# Patient Record
Sex: Male | Born: 1985
Health system: Southern US, Community
[De-identification: ages and names within clinical notes are randomized; demographics above are authoritative.]

## PROBLEM LIST (undated history)

## (undated) DIAGNOSIS — Z8619 Personal history of other infectious and parasitic diseases: Secondary | ICD-10-CM

## (undated) HISTORY — PX: NO PAST SURGERIES: SHX2092

## (undated) HISTORY — DX: Personal history of other infectious and parasitic diseases: Z86.19

---

## 2015-05-28 ENCOUNTER — Ambulatory Visit (INDEPENDENT_AMBULATORY_CARE_PROVIDER_SITE_OTHER): Payer: BLUE CROSS/BLUE SHIELD | Admitting: Family Medicine

## 2015-05-28 ENCOUNTER — Ambulatory Visit (HOSPITAL_COMMUNITY)
Admission: RE | Admit: 2015-05-28 | Discharge: 2015-05-28 | Disposition: A | Discharge status: Home / Self Care | Payer: BLUE CROSS/BLUE SHIELD | Source: Ambulatory Visit | Attending: Family Medicine | Admitting: Family Medicine

## 2015-05-28 ENCOUNTER — Encounter: Payer: Self-pay | Admitting: Family Medicine

## 2015-05-28 ENCOUNTER — Inpatient Hospital Stay (HOSPITAL_COMMUNITY): Admission: RE | Admit: 2015-05-28 | Payer: Self-pay | Source: Ambulatory Visit

## 2015-05-28 VITALS — BP 128/78 | HR 87 | Temp 97.3°F | Ht 70.0 in | Wt 235.6 lb

## 2015-05-28 DIAGNOSIS — R1011 Right upper quadrant pain: Secondary | ICD-10-CM

## 2015-05-28 DIAGNOSIS — M659 Synovitis and tenosynovitis, unspecified: Secondary | ICD-10-CM | POA: Diagnosis not present

## 2015-05-28 DIAGNOSIS — R932 Abnormal findings on diagnostic imaging of liver and biliary tract: Secondary | ICD-10-CM | POA: Diagnosis not present

## 2015-05-28 DIAGNOSIS — IMO0002 Reserved for concepts with insufficient information to code with codable children: Secondary | ICD-10-CM

## 2015-05-28 NOTE — Progress Notes (Signed)
BP 128/78 mmHg  Pulse 87  Temp(Src) 97.3 F (36.3 C) (Oral)  Ht 5\' 10"  (1.778 m)  Wt 235 lb 9.6 oz (106.867 kg)  BMI 33.80 kg/m2   Subjective:    Patient ID: Daniel NgoJerry Speth, male    DOB: [birth year], 29 y.o.   MRN: [id number]  HPI: Daniel Ramos is a 29 y.o. male presenting on 05/28/2015 for RUQ pain and Right elbow pain   HPI Right upper quadrant abdominal pain For the past month patient has had intermittent colicky right upper quadrant abdominal pain that is worse after he eats. The pain is located just under his ribs on the right side. He does not have it on days when he does not eat right away until he eats. He has not noticed any specific foods over any others that make it worse. Today he does not currently have any because he is not eating anything yet today. He has never had this before in his life. The pain does not radiate anywhere else. The pain is described as kind of a crampy colicky abdominal pain.  Right forearm pain Patient has pain in his right forearm that is worse when he is gripping and lifting things at his workplace. He gets better when he rests it for some time but has not fully gone away. This pain has been an issue for the past week. He is taking a little bit of aspirin but nothing consistent for this. He denies any overlying skin changes or weakness or numbness. He has tried to use a brace which did not help much.  Relevant past medical, surgical, family and social history reviewed and updated as indicated. Interim medical history since our last visit reviewed. Allergies and medications reviewed and updated.  Review of Systems  Constitutional: Negative for fever and chills.  HENT: Negative for congestion, ear discharge and ear pain.   Eyes: Negative for discharge and visual disturbance.  Respiratory: Negative for cough, shortness of breath and wheezing.   Cardiovascular: Negative for chest pain and leg swelling.  Gastrointestinal: Positive for abdominal pain.  Negative for nausea, vomiting, diarrhea, constipation, blood in stool and abdominal distention.  Genitourinary: Negative for difficulty urinating.  Musculoskeletal: Positive for myalgias. Negative for back pain, joint swelling, arthralgias and gait problem.  Skin: Negative for rash.  Neurological: Negative for syncope, light-headedness and headaches.  All other systems reviewed and are negative.   Per HPI unless specifically indicated above     Medication List    Notice  As of 05/28/2015  2:06 PM   You have not been prescribed any medications.         Objective:    BP 128/78 mmHg  Pulse 87  Temp(Src) 97.3 F (36.3 C) (Oral)  Ht 5\' 10"  (1.778 m)  Wt 235 lb 9.6 oz (106.867 kg)  BMI 33.80 kg/m2  Wt Readings from Last 3 Encounters:  05/28/15 235 lb 9.6 oz (106.867 kg)    Physical Exam  Constitutional: He is oriented to person, place, and time. He appears well-developed and well-nourished. No distress.  Eyes: Conjunctivae and EOM are normal. Pupils are equal, round, and reactive to light. Right eye exhibits no discharge. No scleral icterus.  Cardiovascular: Normal rate, regular rhythm, normal heart sounds and intact distal pulses.   No murmur heard. Pulmonary/Chest: Effort normal and breath sounds normal. No respiratory distress. He has no wheezes.  Abdominal: Soft. He exhibits no distension. There is no tenderness (No tenderness elicited on exam because patient does not  currently have it.). There is no rebound and no guarding.  Musculoskeletal: Normal range of motion. He exhibits no edema or tenderness (no tenderness elicited upon palpation.  small amounts of aching pain elicited with grip.).  Neurological: He is alert and oriented to person, place, and time. Coordination normal.  Skin: Skin is warm and dry. No rash noted. He is not diaphoretic.  Psychiatric: He has a normal mood and affect. His behavior is normal.  Vitals reviewed.   No results found for this or any  previous visit.    Assessment & Plan:   Problem List Items Addressed This Visit    None    Visit Diagnoses    RUQ abdominal pain    -  Primary    Relevant Orders    US Abdomen Limited RUQ    Tendinitis of elbow or forearm        right, pt is right handed, NSAIDs, return if not improved        Follow up plan: Return if symptoms worsen or fail to improve.  Counseling provided for all of the vaccine components Orders Placed This Encounter  Procedures  . US Abdomen Limited RUQ    Arville Care, MD [facility] 05/28/2015, 2:06 PM

## 2015-06-01 ENCOUNTER — Telehealth: Payer: Self-pay | Admitting: Family Medicine

## 2015-06-01 ENCOUNTER — Other Ambulatory Visit: Payer: Self-pay | Admitting: *Deleted

## 2015-06-01 ENCOUNTER — Other Ambulatory Visit: Payer: Self-pay

## 2015-06-01 DIAGNOSIS — R1012 Left upper quadrant pain: Secondary | ICD-10-CM

## 2015-06-01 DIAGNOSIS — R1011 Right upper quadrant pain: Secondary | ICD-10-CM

## 2015-06-01 NOTE — Telephone Encounter (Signed)
Left message, negative gall bladder scan but referral for HIDA test ordered.

## 2015-06-04 ENCOUNTER — Encounter (HOSPITAL_COMMUNITY): Payer: Self-pay

## 2015-06-04 ENCOUNTER — Encounter (HOSPITAL_COMMUNITY)
Admission: RE | Admit: 2015-06-04 | Discharge: 2015-06-04 | Disposition: A | Discharge status: Home / Self Care | Payer: BLUE CROSS/BLUE SHIELD | Source: Ambulatory Visit | Attending: Family Medicine | Admitting: Family Medicine

## 2015-06-04 DIAGNOSIS — R1012 Left upper quadrant pain: Secondary | ICD-10-CM | POA: Insufficient documentation

## 2015-06-04 MED ORDER — SINCALIDE 5 MCG IJ SOLR
INTRAMUSCULAR | Status: AC
  Administered 2015-06-04: 2.14 ug via INTRAVENOUS
  Filled 2015-06-04: qty 5

## 2015-06-04 MED ORDER — STERILE WATER FOR INJECTION IJ SOLN
INTRAMUSCULAR | Status: AC
  Administered 2015-06-04: 2.14 mL via INTRAVENOUS
  Filled 2015-06-04: qty 10

## 2015-06-04 MED ORDER — TECHNETIUM TC 99M MEBROFENIN IV KIT
5.0000 | PACK | Freq: Once | INTRAVENOUS | Status: AC | PRN
  Administered 2015-06-04: 5.5 via INTRAVENOUS

## 2015-06-04 MED ORDER — SODIUM CHLORIDE 0.9 % IJ SOLN
INTRAMUSCULAR | Status: AC
  Filled 2015-06-04: qty 200

## 2015-06-04 MED ORDER — STERILE WATER FOR INJECTION IJ SOLN
INTRAMUSCULAR | Status: AC
  Filled 2015-06-04: qty 10

## 2015-06-07 ENCOUNTER — Other Ambulatory Visit (HOSPITAL_COMMUNITY): Payer: BLUE CROSS/BLUE SHIELD

## 2017-03-01 DIAGNOSIS — Z23 Encounter for immunization: Secondary | ICD-10-CM | POA: Diagnosis not present

## 2017-08-16 ENCOUNTER — Other Ambulatory Visit: Payer: Self-pay

## 2017-08-16 ENCOUNTER — Ambulatory Visit: Payer: BLUE CROSS/BLUE SHIELD | Admitting: Physician Assistant

## 2017-08-16 ENCOUNTER — Encounter: Payer: Self-pay | Admitting: Physician Assistant

## 2017-08-16 VITALS — BP 118/82 | HR 73 | Temp 98.2°F | Resp 17 | Ht 68.5 in | Wt 240.8 lb

## 2017-08-16 DIAGNOSIS — R21 Rash and other nonspecific skin eruption: Secondary | ICD-10-CM

## 2017-08-16 MED ORDER — PERMETHRIN 5 % EX CREA: 1.0000 "application " | TOPICAL_CREAM | Freq: Once | CUTANEOUS | 0 refills | Status: AC

## 2017-08-16 MED ORDER — TRIAMCINOLONE ACETONIDE 0.1 % EX CREA: 1.0000 "application " | TOPICAL_CREAM | Freq: Two times a day (BID) | CUTANEOUS | 0 refills | Status: DC

## 2017-08-16 NOTE — Progress Notes (Signed)
Patient presents to clinic today to establish care.  Patient endorses a 2 month history of scattered bumps on his body that are intensely pruritic. States that some areas resolve and new ones form. Denies contact with similar symptoms. Denies fever, chills, malaise or fatigue. Does note he travels a lot for work and has to sleep in various hotels.   Past Medical History:  Diagnosis Date  . History of chicken pox     Past Surgical History:  Procedure Laterality Date  . NO PAST SURGERIES      No current outpatient medications on file prior to visit.   No current facility-administered medications on file prior to visit.     Allergies  Allergen Reactions  . Amoxicillin Rash    Family History  Problem Relation Age of Onset  . Miscarriages / [place] Mother   . Alcohol abuse Father   . COPD Maternal Grandmother   . Cancer Maternal Grandfather   . Cancer Paternal Grandmother   . Alcohol abuse Paternal Grandfather   . Stroke Paternal Grandfather   . Asthma Son   . Asthma Son     Social History   Socioeconomic History  . Marital status: Married    Spouse name: Not on file  . Number of children: Not on file  . Years of education: Not on file  . Highest education level: Not on file  Occupational History  . Not on file  Social Needs  . Financial resource strain: Not on file  . Food insecurity:    Worry: Not on file    Inability: Not on file  . Transportation needs:    Medical: Not on file    Non-medical: Not on file  Tobacco Use  . Smoking status: Never Smoker  . Smokeless tobacco: Former Neurosurgeon    Types: Chew  Substance and Sexual Activity  . Alcohol use: No    Alcohol/week: 0.0 oz  . Drug use: No  . Sexual activity: Yes    Comment: married 12 years  Lifestyle  . Physical activity:    Days per week: Not on file    Minutes per session: Not on file  . Stress: Not on file  Relationships  . Social connections:    Talks on phone: Not on file    Gets  together: Not on file    Attends religious service: Not on file    Active member of club or organization: Not on file    Attends meetings of clubs or organizations: Not on file    Relationship status: Not on file  . Intimate partner violence:    Fear of current or ex partner: Not on file    Emotionally abused: Not on file    Physically abused: Not on file    Forced sexual activity: Not on file  Other Topics Concern  . Not on file  Social History Narrative  . Not on file   Review of Systems  Constitutional: Negative for fever and weight loss.  HENT: Negative for ear discharge, ear pain, hearing loss and tinnitus.   Eyes: Negative for blurred vision, double vision, photophobia and pain.  Respiratory: Negative for cough and shortness of breath.   Cardiovascular: Negative for chest pain and palpitations.  Gastrointestinal: Negative for abdominal pain, blood in stool, constipation, diarrhea, heartburn, melena, nausea and vomiting.  Genitourinary: Negative for dysuria, flank pain, frequency, hematuria and urgency.  Musculoskeletal: Negative for falls.  Skin: Positive for itching and rash.  Neurological: Negative for  dizziness, loss of consciousness and headaches.  Endo/Heme/Allergies: Negative for environmental allergies.  Psychiatric/Behavioral: Negative for depression, hallucinations, substance abuse and suicidal ideas. The patient is not nervous/anxious and does not have insomnia.    BP 118/82   Pulse 73   Temp 98.2 F (36.8 C) (Oral)   Resp 17   Ht 5' 8.5" (1.74 m)   Wt 240 lb 12.8 oz (109.2 kg)   SpO2 97%   BMI 36.08 kg/m   Physical Exam  Constitutional: He is oriented to person, place, and time and well-developed, well-nourished, and in no distress.  HENT:  Head: Normocephalic and atraumatic.  Eyes: Conjunctivae are normal.  Neck: Neck supple.  Cardiovascular: Normal rate, regular rhythm, normal heart sounds and intact distal pulses.  Pulmonary/Chest: Effort normal and  breath sounds normal. No respiratory distress. He has no wheezes. He has no rales. He exhibits no tenderness.  Neurological: He is alert and oriented to person, place, and time.  Skin: Skin is warm and dry.  Scattered macular lesions of torso, back, lower shins/ankles, forearms. No burrowing noted. No linear pattern. Exam negative for dermatographia. Lesions do not blanch with pressure. Concern for bites.   Psychiatric: Affect normal.  Vitals reviewed.  Assessment/Plan: 1. Rash and nonspecific skin eruption Concern for bites. Discussed measures to take at home. Thoroughly clean all travel bags. Rx Permethrin. Kenalog given. OTC medications and supportive measures reviewed. Close follow-up scheduled. Will do CPE at that time.   - permethrin (ELIMITE) 5 % cream; Apply 1 application topically once for 1 dose. Wash off in 7-8 hours as directed on tubing.  Dispense: 60 g; Refill: 0 - triamcinolone cream (KENALOG) 0.1 %; Apply 1 application topically 2 (two) times daily. For 2 weeks max.  Dispense: 30 g; Refill: 0   Piedad ClimesWilliam Cody Ivannah Zody, PA-C

## 2017-08-16 NOTE — Patient Instructions (Signed)
Please apply the permethrin in the evening. Wash off as directed.  The triamcinolone ointment can be applied to current bites to help with itch. Do not use more than as directed.  Start a Claritin daily over the next week to help with itch until we get this resolved.   I would wash out travel bags thoroughly as this could be a potential vector carrying the culprit.   Follow-up with me in 1-2 weeks for a physical

## 2017-12-04 ENCOUNTER — Ambulatory Visit: Payer: BLUE CROSS/BLUE SHIELD

## 2017-12-04 ENCOUNTER — Other Ambulatory Visit: Payer: Self-pay | Admitting: Family Medicine

## 2017-12-04 DIAGNOSIS — M542 Cervicalgia: Secondary | ICD-10-CM

## 2017-12-21 DIAGNOSIS — Z111 Encounter for screening for respiratory tuberculosis: Secondary | ICD-10-CM | POA: Diagnosis not present

## 2018-02-24 DIAGNOSIS — Z23 Encounter for immunization: Secondary | ICD-10-CM | POA: Diagnosis not present

## 2018-02-27 ENCOUNTER — Telehealth: Payer: Self-pay | Admitting: Emergency Medicine

## 2018-02-27 DIAGNOSIS — Z0184 Encounter for antibody response examination: Secondary | ICD-10-CM

## 2018-02-27 NOTE — Telephone Encounter (Signed)
He can have lab appt only for MMR titers

## 2018-02-27 NOTE — Telephone Encounter (Signed)
Please advise if patient needs a lab visit only or to schedule a CPE  Copied from [place] [id number]. Topic: General - Other >> Feb 27, 2018  2:31 PM Yvette Rack wrote: Reason for CRM: pt wife Janett Billow calling to schedule an appt for him to have a MMR titer for his job please call at [phone]

## 2018-02-28 NOTE — Addendum Note (Signed)
Addended by: Con Memos on: 02/28/2018 12:54 PM   Modules accepted: Orders

## 2018-02-28 NOTE — Telephone Encounter (Signed)
Advised patient wife ok per PCP to schedule for lab visit for MMR titers. Orders placed.  CRM created and ok for [facility] to schedule lab visit for MMR titer.

## 2018-03-08 ENCOUNTER — Other Ambulatory Visit: Payer: BLUE CROSS/BLUE SHIELD

## 2018-03-13 ENCOUNTER — Other Ambulatory Visit (INDEPENDENT_AMBULATORY_CARE_PROVIDER_SITE_OTHER): Payer: BLUE CROSS/BLUE SHIELD

## 2018-03-13 DIAGNOSIS — Z0184 Encounter for antibody response examination: Secondary | ICD-10-CM | POA: Diagnosis not present

## 2018-03-14 LAB — MEASLES/MUMPS/RUBELLA IMMUNITY
Mumps IgG: 247 AU/mL
Rubella: 5.08 index
Rubeola IgG: >300 AU/mL

## 2018-04-16 ENCOUNTER — Other Ambulatory Visit: Payer: Self-pay | Admitting: Physician Assistant

## 2018-04-16 ENCOUNTER — Encounter: Payer: Self-pay | Admitting: Physician Assistant

## 2018-04-16 DIAGNOSIS — Z0184 Encounter for antibody response examination: Secondary | ICD-10-CM

## 2018-04-19 ENCOUNTER — Other Ambulatory Visit (INDEPENDENT_AMBULATORY_CARE_PROVIDER_SITE_OTHER): Payer: BLUE CROSS/BLUE SHIELD

## 2018-04-19 DIAGNOSIS — Z0184 Encounter for antibody response examination: Secondary | ICD-10-CM | POA: Diagnosis not present

## 2018-04-22 LAB — VARICELLA ZOSTER ANTIBODY, IGG: Varicella IgG: 1264 index

## 2018-09-19 ENCOUNTER — Encounter: Payer: Self-pay | Admitting: Emergency Medicine

## 2019-03-14 DIAGNOSIS — Z111 Encounter for screening for respiratory tuberculosis: Secondary | ICD-10-CM | POA: Diagnosis not present

## 2019-03-14 DIAGNOSIS — Z23 Encounter for immunization: Secondary | ICD-10-CM | POA: Diagnosis not present

## 2019-03-16 DIAGNOSIS — Z111 Encounter for screening for respiratory tuberculosis: Secondary | ICD-10-CM | POA: Diagnosis not present

## 2020-10-12 ENCOUNTER — Ambulatory Visit (INDEPENDENT_AMBULATORY_CARE_PROVIDER_SITE_OTHER): Payer: Medicaid Other | Admitting: Family Medicine

## 2020-10-12 ENCOUNTER — Encounter: Payer: Self-pay | Admitting: Family Medicine

## 2020-10-12 ENCOUNTER — Ambulatory Visit (INDEPENDENT_AMBULATORY_CARE_PROVIDER_SITE_OTHER): Payer: Medicaid Other

## 2020-10-12 ENCOUNTER — Ambulatory Visit: Payer: Self-pay

## 2020-10-12 ENCOUNTER — Other Ambulatory Visit: Payer: Self-pay

## 2020-10-12 VITALS — BP 140/92 | HR 95 | Temp 98.7°F | Ht 69.0 in | Wt 244.6 lb

## 2020-10-12 VITALS — BP 140/92 | HR 95 | Ht 69.0 in | Wt 244.0 lb

## 2020-10-12 DIAGNOSIS — R030 Elevated blood-pressure reading, without diagnosis of hypertension: Secondary | ICD-10-CM

## 2020-10-12 DIAGNOSIS — M7541 Impingement syndrome of right shoulder: Secondary | ICD-10-CM

## 2020-10-12 DIAGNOSIS — M25511 Pain in right shoulder: Secondary | ICD-10-CM | POA: Diagnosis not present

## 2020-10-12 DIAGNOSIS — G8929 Other chronic pain: Secondary | ICD-10-CM

## 2020-10-12 MED ORDER — TRIAMCINOLONE ACETONIDE 40 MG/ML IJ SUSP
40.0000 mg | Freq: Once | INTRAMUSCULAR | Status: AC
  Administered 2020-10-12: 40 mg via INTRA_ARTICULAR

## 2020-10-12 NOTE — Patient Instructions (Signed)
Thank you for coming in today.  I've referred you to Physical Therapy.  Let us know if you don't hear from them in one week.  Recheck with me in 6 weeks.  If not better we can either do an injection or get MRI.   Let me know if you have a problem.

## 2020-10-12 NOTE — Progress Notes (Signed)
Subjective:    I'm seeing this patient as a consultation for:  Dr. Mardelle Matte. Note will be routed back to referring provider/PCP.  CC: R shoulder pain  I, Molly Weber, LAT, ATC, am serving as scribe for Dr. [name].  HPI: Pt is a 35 y/o male presenting w/ R shoulder pain x 8-9 months w/ no specific MOI noted.  He locates his pain to the anterior aspect of shoulder. Pt was seen earlier today by his PCP and had a R interarticular shoulder injection. Pt report not much relief from the steroid injection earlier today.  Radiating pain: no Neck pain: no R shoulder mechanical symptoms: no Aggravating factors: R shoulder overhead AROM, ER, flexion Treatments tried: R shoulder traimcinolone injection-10/12/20; Naprosyn  Diagnostic testing: R shoulder XR- 10/12/20  Past medical history, Surgical history, Family history, Social history, Allergies, and medications have been entered into the medical record, reviewed.   Review of Systems: No new headache, visual changes, nausea, vomiting, diarrhea, constipation, dizziness, abdominal pain, skin rash, fevers, chills, night sweats, weight loss, swollen lymph nodes, body aches, joint swelling, muscle aches, chest pain, shortness of breath, mood changes, visual or auditory hallucinations.   Objective:    Vitals:   10/12/20 1409  BP: (!) 140/92  Pulse: 95  SpO2: 98%   General: Well Developed, well nourished, and in no acute distress.  Neuro/Psych: Alert and oriented x3, extra-ocular muscles intact, able to move all 4 extremities, sensation grossly intact. Skin: Warm and dry, no rashes noted.  Respiratory: Not using accessory muscles, speaking in full sentences, trachea midline.  Cardiovascular: Pulses palpable, no extremity edema. Abdomen: Does not appear distended. MSK: Right shoulder Normal-appearing Range of motion abduction full painful arc.  Internal rotation lumbar spine external rotation fall pain with full external rotation Positive  Hawkins and Neer's test. Positive empty can test. Strength 4+5 abduction 5/5 external and internal rotation. Negative Yergason's and speeds test. Minimally positive crossover arm compression test. Pulses capillary refill and sensation are intact distally.    Lab and Radiology Results  X-ray images right shoulder obtained today personally and independently interpreted Mild AC DJD.  No acute fractures are visible. Await formal radiology review  Diagnostic Limited MSK Ultrasound of: Right shoulder Biceps tendon intact normal-appearing Subscapularis tendon is intact. Supraspinatus tendon is intact with no visible tear. Mild subacromial bursitis is present. Infraspinatus tendon is intact. AC joint narrowed and degenerative Impression: Subacromial bursitis and mild AC DJD   Impression and Recommendations:    Assessment and Plan: 35 y.o. male with chronic right shoulder pain.  Predominant cause of pain thought to be rotator cuff tendinopathy and subacromial bursitis.  He does have some AC DJD however I think this is less of a factor.  He had a shoulder injection today performed by PCP.  I think this was a intra-articular shoulder injection.  It did not help immediately however the steroid component of it I think will provide some benefit over the next few days.  Physical therapy however should be the main treatment modality at this point.  I think he has a good rehab potential.  Plan on physical therapy and recheck in 6 weeks.  If not better next step would be either MRI or subacromial injection.  PDMP not reviewed this encounter. Orders Placed This Encounter  Procedures  . [place] LIMITED JOINT SPACE STRUCTURES UP RIGHT(NO LINKED CHARGES)    Standing Status:   Future    Number of Occurrences:   1    Standing  Expiration Date:   04/14/2021    Order Specific Question:   Reason for Exam (SYMPTOM  OR DIAGNOSIS REQUIRED)    Answer:   chronic right shoulder pain    Order Specific Question:    Preferred imaging location?    Answer:   Adult nurse [facility]  . Ambulatory referral to Physical Therapy    Referral Priority:   Routine    Referral Type:   Physical Medicine    Referral Reason:   Specialty Services Required    Requested Specialty:   Physical Therapy   No orders of the defined types were placed in this encounter.   Discussed warning signs or symptoms. Please see discharge instructions. Patient expresses understanding.   The above documentation has been reviewed and is accurate and complete [name], M.D.

## 2020-10-12 NOTE — Progress Notes (Signed)
Subjective  CC:  Chief Complaint  Patient presents with  . Establish Care  . Shoulder Pain    On going for a year. Thought he pulled a muscle last summer, and pain never improved.     HPI: Daniel Ramos is a 35 y.o. male who presents to [facility] [place] [facility] today to establish care with me as a new patient.   He has the following concerns or needs:  Healthy 35 year old male who is overdue for a complete physical exam but wishes to defer that for now.  I reviewed old records.  He presents due to 8 to 29-month history of right shoulder pain.  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  Last summer, he was swimming with his son at camp for a week.  Then came home and started swimming.  He was freestyle swimming and had acute sharp pain in the right shoulder with the overhead stroke.  Since then, he has had persistent and significant pain with certain arm movements.  If he raises his arm above his shoulder he will have sharp pain.  He cannot lift greater than 15 pounds without significant pain.  If he reaches out front of him it hurts.  He denies neck pain or radicular symptoms.  No weakness.  He has used Naprosyn daily for the last 5 to 6 months hoping it would recover but it has persisted.  No prior history of shoulder problems.  Assessment  1. Rotator cuff impingement syndrome, right   2. Chronic right shoulder pain   3. Elevated blood pressure reading without diagnosis of hypertension      Plan   Chronic right shoulder pain: Exam consistent with rotator cuff tendinopathy and impingement syndrome.  Trial of steroid injection although warned it may not be successful.  Check x-rays and refer to sports medicine given chronicity of his problems.  Likely will need ultrasound or MRI.  May need physical therapy.  Referral placed.  Elevated blood pressure today: We will recheck at sports medicine visit.  Also recheck and physical.  Patient  reports he usually has low blood pressures.  May be due to pain or anxiety at today's visit.  Follow up: Recommend complete physical with lab work. Orders Placed This Encounter  Procedures  . DG Shoulder Right  . Ambulatory referral to Sports Medicine   Meds ordered this encounter  Medications  . triamcinolone acetonide (KENALOG-40) injection 40 mg     Depression screen [facility] 2/9 10/12/2020 08/16/2017 05/28/2015  Decreased Interest 0 0 0  Down, Depressed, Hopeless 0 0 0  PHQ - 2 Score 0 0 0  Altered sleeping - 0 -  Tired, decreased energy - 0 -  Change in appetite - 0 -  Feeling bad or failure about yourself  - 0 -  Trouble concentrating - 0 -  Moving slowly or fidgety/restless - 0 -  Suicidal thoughts - 0 -  PHQ-9 Score - 0 -    We updated and reviewed the patient's past history in detail and it is documented below.  There are no problems to display for this patient.  Health Maintenance  Topic Date Due  . HIV Screening  Never done  . Hepatitis C Screening  Never done  . INFLUENZA VACCINE  12/27/2020  . TETANUS/TDAP  05/28/2023  . HPV VACCINES  Aged Out  . COVID-19 Vaccine  Discontinued   Immunization History  Administered Date(s)  Administered  . Influenza,inj,quad, With Preservative 03/29/2015  . Influenza-Unspecified 03/01/2017  . PPD Test 10/01/2014   No outpatient medications have been marked as taking for the 10/12/20 encounter (Office Visit) with Willow Ora, MD.    Allergies: Patient is allergic to amoxicillin. Past Medical History Patient  has a past medical history of History of chicken pox. Past Surgical History Patient  has a past surgical history that includes No past surgeries. Family History: Patient family history includes Alcohol abuse in his father and paternal grandfather; Asthma in his son; Brain cancer in his paternal grandmother; COPD in his maternal grandmother; Healthy in his sister and son; Miscarriages / [place] in his mother;  Pancreatic cancer in his maternal grandfather; Stroke in his paternal grandfather. Social History:  Patient  reports that he has never smoked. His smokeless tobacco use includes chew. He reports previous alcohol use. He reports that he does not use drugs.  Review of Systems: Constitutional: negative for fever or malaise Ophthalmic: negative for photophobia, double vision or loss of vision Cardiovascular: negative for chest pain, dyspnea on exertion, or new LE swelling Respiratory: negative for SOB or persistent cough Gastrointestinal: negative for abdominal pain, change in bowel habits or melena Genitourinary: negative for dysuria or gross hematuria Musculoskeletal: negative for new gait disturbance or muscular weakness Integumentary: negative for new or persistent rashes Neurological: negative for TIA or stroke symptoms Psychiatric: negative for SI or delusions Allergic/Immunologic: negative for hives  Patient Care Team    Relationship Specialty Notifications Start End  Willow Ora, MD PCP - General Family Medicine  10/12/20     Objective  Vitals: BP (!) 140/92   Pulse 95   Temp 98.7 F (37.1 C)   Ht 5\' 9"  (1.753 m)   Wt 244 lb 9.6 oz (110.9 kg)   SpO2 98%   BMI 36.12 kg/m  General:  Well developed, well nourished, no acute distress  Psych:  Alert and oriented,normal mood and affect MSK: no deformities, contusions. Joints are without erythema or swelling Right shoulder: Pain with abduction past 90 degrees.  Full passive range of motion.  Positive empty can testing with pain.  Negative drop test.  Supple neck. Skin:  Warm, no rashes or suspicious lesions noted Neurologic:    Mental status is normal. Gross motor and sensory exams are normal. Normal gait   Commons side effects, risks, benefits, and alternatives for medications and treatment plan prescribed today were discussed, and the patient expressed understanding of the given instructions. Patient is instructed to call or  message via MyChart if he/she has any questions or concerns regarding our treatment plan. No barriers to understanding were identified. We discussed Red Flag symptoms and signs in detail. Patient expressed understanding regarding what to do in case of urgent or emergency type symptoms.   Medication list was reconciled, printed and provided to the patient in AVS. Patient instructions and summary information was reviewed with the patient as documented in the AVS. This note was prepared with assistance of Dragon voice recognition software. Occasional wrong-word or sound-a-like substitutions may have occurred due to the inherent limitations of voice recognition software  This visit occurred during the SARS-CoV-2 public health emergency.  Safety protocols were in place, including screening questions prior to the visit, additional usage of staff PPE, and extensive cleaning of exam room while observing appropriate contact time as indicated for disinfecting solutions.

## 2020-10-12 NOTE — Patient Instructions (Addendum)
Please return in 3 months for your annual complete physical; please come fasting.  I will let you know what your shoulder xray results are via mychart.    We will call you with information regarding your referral appointment. [facility] for further evaluation.  If you do not hear from Daniel Ramos within the next 2 weeks, please let me know. It can take 1-2 weeks to get appointments set up with the specialists.   If you have any questions or concerns, please don't hesitate to send me a message via MyChart or call the office at [phone]. Thank you for visiting with Daniel Ramos today! It's our pleasure caring for you.  You had a steroid injection today.   Things to be aware of after this injection are listed below:  You may experience no significant improvement or even a slight worsening in your symptoms during the first 24 to 48 hours.  After that we expect your symptoms to improve gradually over the next 2 weeks for the medicine to have its maximal effect.  You should continue to have improvement out to 6 weeks after your injection.  I recommend icing the site of the injection for 20 minutes  1-2 times the day of your injection  You may shower but no swimming, tub bath or Jacuzzi for 24 hours.  If your bandage falls off this does not need to be replaced.  It is appropriate to remove the bandage after 4 hours.  You may resume light activities as tolerated.     POSSIBLE PROCEDURE SIDE EFFECTS: The side effects of the injection are usually fairly minimal however if you may experience some of the following side effects that are usually self-limited and will is off on their own.  If you are concerned please feel free to call the office with questions:             Increased numbness or tingling             Nausea or vomiting             Swelling or bruising at the injection site    Please call our office if if you experience any of the following symptoms over the next week as these can be signs  of infection:              Fever greater than 100.63F             Significant swelling at the injection site             Significant redness or drainage from the injection site     Shoulder Impingement Syndrome  Shoulder impingement syndrome is a condition that causes pain when connective tissues (tendons) surrounding the shoulder joint become pinched. These tendons are part of the group of muscles and tissues that help to stabilize the shoulder (rotator cuff). Beneath the rotator cuff is a fluid-filled sac (bursa) that allows the muscles and tendons to glide smoothly. The bursa may become swollen or irritated (bursitis). Bursitis, swelling in the rotator cuff tendons, or both conditions can decrease how much space is under a bone in the shoulder joint (acromion), resulting in impingement. What are the causes? Shoulder impingement syndrome may be caused by bursitis or swelling of the rotator cuff tendons, which may result from:  Repetitive overhead arm movements.  Falling onto the shoulder.  Weakness in the shoulder muscles. What increases the risk? You may be more likely to develop this condition if you:  Play sports that involve throwing, such as baseball.  Participate in sports such as tennis, volleyball, and swimming.  Work as a Education administrator, Music therapist, or Pharmacologist. Some people are also more likely to develop impingement syndrome because of the shape of their acromion bone. What are the signs or symptoms? The main symptom of this condition is pain on the front or side of the shoulder. The pain may:  Get worse when lifting or raising the arm.  Get worse at night.  Wake you up from sleeping.  Feel sharp when the shoulder is moved and then fade to an ache. Other symptoms may include:  Tenderness.  Stiffness.  Inability to raise the arm above shoulder level or behind the body.  Weakness. How is this diagnosed? This condition may be diagnosed based on:  Your symptoms  and medical history.  A physical exam.  Imaging tests, such as: ? X-rays. ? MRI. ? Ultrasound. How is this treated? This condition may be treated by:  Resting your shoulder and avoiding all activities that cause pain or put stress on the shoulder.  Icing your shoulder.  NSAIDs to help reduce pain and swelling.  One or more injections of medicines to numb the area and reduce inflammation.  Physical therapy.  Surgery. This may be needed if nonsurgical treatments have not helped. Surgery may involve repairing the rotator cuff, reshaping the acromion, or removing the bursa. Follow these instructions at home: Managing pain, stiffness, and swelling  If directed, put ice on the injured area. ? Put ice in a plastic bag. ? Place a towel between your skin and the bag. ? Leave the ice on for 20 minutes, 2-3 times a day.   Activity  Rest and return to your normal activities as told by your health care provider. Ask your health care provider what activities are safe for you.  Do exercises as told by your health care provider. General instructions  Do not use any products that contain nicotine or tobacco, such as cigarettes, e-cigarettes, and chewing tobacco. These can delay healing. If you need help quitting, ask your health care provider.  Ask your health care provider when it is safe for you to drive.  Take over-the-counter and prescription medicines only as told by your health care provider.  Keep all follow-up visits as told by your health care provider. This is important. How is this prevented?  Give your body time to rest between periods of activity.  Be safe and responsible while being active. This will help you avoid falls.  Maintain physical fitness, including strength and flexibility. Contact a health care provider if:  Your symptoms have not improved after 1-2 months of treatment and rest.  You cannot lift your arm away from your body. Summary  Shoulder  impingement syndrome is a condition that causes pain when connective tissues (tendons) surrounding the shoulder joint become pinched.  The main symptom of this condition is pain on the front or side of the shoulder.  This condition is usually treated with rest, ice, and pain medicines as needed. This information is not intended to replace advice given to you by your health care provider. Make sure you discuss any questions you have with your health care provider. Document Revised: 09/06/2018 Document Reviewed: 11/07/2017 Elsevier Patient Education  2021 [employer].

## 2020-10-15 ENCOUNTER — Encounter: Payer: Self-pay | Admitting: Family Medicine

## 2020-10-18 ENCOUNTER — Ambulatory Visit: Payer: Medicaid Other | Admitting: Physical Therapy

## 2020-11-01 ENCOUNTER — Other Ambulatory Visit: Payer: Self-pay

## 2020-11-01 ENCOUNTER — Ambulatory Visit: Payer: Medicaid Other | Attending: Family Medicine | Admitting: Physical Therapy

## 2020-11-01 ENCOUNTER — Encounter: Payer: Self-pay | Admitting: Physical Therapy

## 2020-11-01 DIAGNOSIS — M25511 Pain in right shoulder: Secondary | ICD-10-CM | POA: Insufficient documentation

## 2020-11-01 DIAGNOSIS — M6281 Muscle weakness (generalized): Secondary | ICD-10-CM | POA: Diagnosis present

## 2020-11-01 DIAGNOSIS — M25611 Stiffness of right shoulder, not elsewhere classified: Secondary | ICD-10-CM | POA: Diagnosis present

## 2020-11-01 DIAGNOSIS — G8929 Other chronic pain: Secondary | ICD-10-CM | POA: Insufficient documentation

## 2020-11-01 DIAGNOSIS — R293 Abnormal posture: Secondary | ICD-10-CM | POA: Diagnosis present

## 2020-11-01 NOTE — Therapy (Addendum)
[facility] [address]  [address] [place], [place], [address] Phone: [phone]   Fax:  [phone]  Physical Therapy Evaluation  Patient Details  Name: Daniel Ramos MRN: [id number] Date of Birth: 04-15-1986 Referring Provider (PT): Rodolph Bong, MD   Encounter Date: 11/01/2020   PT End of Session - 11/01/20 1107    Visit Number 1    Number of Visits 13    Date for PT Re-Evaluation 12/13/20    Authorization Type [employer]    PT Start Time 1107    PT Stop Time 1151    PT Time Calculation (min) 44 min    Activity Tolerance Patient tolerated treatment well    Behavior During Therapy [facility] for tasks assessed/performed           Past Medical History:  Diagnosis Date  . History of chicken pox     Past Surgical History:  Procedure Laterality Date  . NO PAST SURGERIES      There were no vitals filed for this visit.    Subjective Assessment - 11/01/20 1111    Subjective Pt reports onset of sharp pain in R shoulder while swimming crawl stroke last summer - had completed swim test at [employer] camp w/o issue but when tried to go swimming at home upon returning from camp, noted a sharp pain. Pain has been constant with overhead or rotational movement of R arm since, although typically no pain at rest.    Limitations House hold activities;Lifting    Diagnostic tests R shoulder x-ray 10/12/20 - Mild degenerative change without acute bony abnormality.  R shoulder [place] 10/12/20 - Subacromial bursitis and mild AC DJD    Patient Stated Goals "my shoulder to be 100% w/o having to have surgery"    Currently in Pain? No/denies    Pain Score 0-No pain   up to 10/10 with some motions of R arm   Pain Location Shoulder    Pain Orientation Right;Anterior    Pain Descriptors / Indicators Sharp   "quick"   Pain Type Chronic pain    Pain Radiating Towards n/a    Pain Onset More than a month ago   summer 2021   Pain Frequency  Intermittent    Aggravating Factors  any motion at or above shoulder height or any rotation of shoulder with arm away from body; lifting    Pain Relieving Factors Naproxen - minimal relief    Effect of Pain on Daily Activities difficultly lifting things out of truck bed or any activity reaching overhead; interferes with normal sleeping position              [facility] PT Assessment - 11/01/20 1107      Assessment   Medical Diagnosis Chronic R shoulder pain    Referring Provider (PT) Rodolph Bong, MD    Onset Date/Surgical Date --   summer 2021   Hand Dominance Right    Next MD Visit ~6 wks upon completion of PT    Prior Therapy remote h/o PT for knee in HS      Precautions   Precautions None      Restrictions   Weight Bearing Restrictions No      Balance Screen   Has the patient fallen in the past 6 months No    Has the patient had a decrease in activity level because of a fear of falling?  No    Is the patient  reluctant to leave their home because of a fear of falling?  No      Home [employer] manag[employer]vironment   Living Environment Private residence      Prior Function   Level of Independence Independent    Vocation Unemployed    Leisure golfing, swimming - limited by pain; sports with his boys      Cognition   Overall Cognitive Status Within Functional Limits for tasks assessed      Observation/Other Assessments   Focus on Therapeutic Outcomes (FOTO)  Shoulder = 62; predicted D/C FS = 74      Posture/Postural Control   Posture/Postural Control Postural limitations    Postural Limitations Forward head;Rounded Shoulders      ROM / Strength   AROM / PROM / Strength AROM;Strength      AROM   Overall AROM Comments B shoulder FIR/FER symmetrical and WFL    AROM Assessment Site Shoulder    Right/Left Shoulder Right;Left    Right Shoulder Flexion 151 Degrees    Right Shoulder ABduction 159 Degrees    Right Shoulder Internal Rotation 89 Degrees   painful   Right Shoulder External  Rotation 88 Degrees   painful   Left Shoulder Flexion 160 Degrees    Left Shoulder ABduction 180 Degrees      Strength   Strength Assessment Site Shoulder    Right/Left Shoulder Right;Left    Right Shoulder Flexion 4+/5    Right Shoulder Extension 4+/5    Right Shoulder ABduction 4/5   painful   Right Shoulder Internal Rotation 4+/5    Right Shoulder External Rotation 4/5   painful   Left Shoulder Flexion 5/5    Left Shoulder Extension 5/5    Left Shoulder ABduction 5/5    Left Shoulder Internal Rotation 5/5    Left Shoulder External Rotation 5/5      Palpation   Palpation comment increased muscle tension in pecs and anterior deltoid but denies TTP      Special Tests    Special Tests Rotator Cuff Impingement    Rotator Cuff Impingment tests Leanord AsalHawkins- Kennedy test;Neer impingement test;Empty Can test      Neer Impingement test    Findings Positive    Side Right      Hawkins-Kennedy test   Findings Positive    Side Right      Empty Can test   Findings Positive    Side Right                      Objective measurements completed on examination: See above findings.               PT Education - 11/01/20 1151    Education Details PT eval findings, anticipated POC & initial HEP - Access Code: [email or web address]    Person(s) Educated Patient    Methods Explanation;Demonstration;Verbal cues;Tactile cues;Handout    Comprehension Verbalized understanding;Verbal cues required;Tactile cues required;Returned demonstration;Need further instruction            PT Short Term Goals - 11/01/20 1151      PT SHORT TERM GOAL #1   Title Patient will be independent with initial HEP    Status New    Target Date 11/15/20             PT Long Term Goals - 11/01/20 1151      PT LONG TERM GOAL #1   Title Patient will be independent with ongoing/advanced HEP for self-management at home  Status New    Target Date 12/13/20      PT LONG TERM GOAL #2   Title Improve  posture and alignment with patient to demonstrate improved upright posture with posterior shoulder girdle engaged    Status New    Target Date 12/13/20      PT LONG TERM GOAL #3   Title Patient to improve R shoulder AROM to WNL without pain provocation    Status New    Target Date 12/13/20      PT LONG TERM GOAL #4   Title Patient will demonstrate improved R shoulder strength to 5/5 w/o pain for functional UE use    Status New    Target Date 12/13/20      PT LONG TERM GOAL #5   Title Patient to report ability to perform ADLs, household, and sport-related activities without limitation due to R shoulder pain, LOM or weakness    Status New    Target Date 12/13/20                  Plan - 11/01/20 1151    Clinical Impression Statement Jolan is a 35 y/o male who presents to OP PT for chronic R shoulder pain presumed to be related to rotator cuff tendinopathy and subacromial bursitis. Pain originated last summer while swimming crawl/freestyle stroke and persists as a quick sharp pain with any overhead or rotational motion of his R shoulder as well as with lifting away from his body. Deficits include forward head and rounded shoulder posture, mildly limited R shoulder AROM with pain in overhead ROM and with extremes of IR/ER, increased muscle tension/tightness in anterior and posterior/inferior shoulder, and mild weakness with decreased scapular muscle activation. Berwyn will benefit from skilled PT to address above deficits, improve posture and restore pain-free ROM and strength to allow him to resume normal daily activities and workouts without pain interference.    Personal Factors and Comorbidities Time since onset of injury/illness/exacerbation;Past/Current Experience    Examination-Activity Limitations Lift;Carry;Reach Overhead    Examination-Participation Restrictions Community Activity;Other   sports   Stability/Clinical Decision Making Stable/Uncomplicated    Clinical Decision  Making Low    Rehab Potential Good    PT Frequency 2x / week    PT Duration 6 weeks   4-6 weeks   PT Treatment/Interventions ADLs/Self Care Home Management;Cryotherapy;Moist Heat;Ultrasound;Therapeutic activities;Therapeutic exercise;Neuromuscular re-education;Patient/family education;Manual techniques;Passive range of motion;Dry needling;Taping;Joint Manipulations    PT Next Visit Plan Review initial HEP; postural and RTC strengthening avoiding impingement    PT Home Exercise Plan Access Code: [email or web address]    Consulted and Agree with Plan of Care Patient           Patient will benefit from skilled therapeutic intervention in order to improve the following deficits and impairments:  Decreased activity tolerance,Decreased knowledge of precautions,Decreased range of motion,Decreased strength,Increased fascial restricitons,Increased muscle spasms,Impaired perceived functional ability,Impaired flexibility,Impaired UE functional use,Improper body mechanics,Postural dysfunction,Pain  Visit Diagnosis: Chronic right shoulder pain - Plan: PT plan of care cert/re-cert  Abnormal posture - Plan: PT plan of care cert/re-cert  Stiffness of right shoulder, not elsewhere classified - Plan: PT plan of care cert/re-cert  Muscle weakness (generalized) - Plan: PT plan of care cert/re-cert     Problem List There are no problems to display for this patient.   Marry Guan, PT, MPT 11/01/2020, 1:07 PM  [facility] [address]  [address] [place], [place], [address] Phone: [phone]  Fax:  [phone]  Name: Eshaan Titzer MRN: [id number] Date of Birth: Aug 22, 1985

## 2020-11-01 NOTE — Patient Instructions (Signed)
     Access Code: [email or web address] URL: [email or web address] Date: 11/01/2020 Prepared by: Glenetta Hew  Exercises Doorway Pec Stretch at 60 Degrees Abduction with Arm Straight - 2-3 x daily - 7 x weekly - 3 reps - 30 sec hold Doorway Pec Stretch at 90 Degrees Abduction - 2-3 x daily - 7 x weekly - 3 reps - 30 sec hold Doorway Pec Stretch at 120 Degrees Abduction - 2-3 x daily - 7 x weekly - 3 reps - 30 sec hold Standing Shoulder Row with Anchored Resistance - 1 x daily - 7 x weekly - 2 sets - 10 reps - 5 sec hold Scapular Retraction with Resistance Advanced - 1 x daily - 7 x weekly - 2 sets - 10 reps - 5 sec hold

## 2020-11-10 ENCOUNTER — Ambulatory Visit: Payer: Medicaid Other

## 2020-11-10 ENCOUNTER — Other Ambulatory Visit: Payer: Self-pay

## 2020-11-10 DIAGNOSIS — G8929 Other chronic pain: Secondary | ICD-10-CM

## 2020-11-10 DIAGNOSIS — M25511 Pain in right shoulder: Secondary | ICD-10-CM | POA: Diagnosis not present

## 2020-11-10 DIAGNOSIS — M6281 Muscle weakness (generalized): Secondary | ICD-10-CM

## 2020-11-10 DIAGNOSIS — M25611 Stiffness of right shoulder, not elsewhere classified: Secondary | ICD-10-CM

## 2020-11-10 DIAGNOSIS — R293 Abnormal posture: Secondary | ICD-10-CM

## 2020-11-10 NOTE — Therapy (Signed)
[facility] [address]  [address] [place], [place], [address] Phone: [phone]   Fax:  [phone]  Physical Therapy Treatment  Patient Details  Name: Daniel Ramos MRN: [id number] Date of Birth: 05/23/1986 Referring Provider (PT): Rodolph Bong, MD   Encounter Date: 11/10/2020   PT End of Session - 11/10/20 1205     Visit Number 2    Number of Visits 13    Date for PT Re-Evaluation 12/13/20    Authorization Type [employer]    PT Start Time 1022    PT Stop Time 1103    PT Time Calculation (min) 41 min    Activity Tolerance Patient tolerated treatment well    Behavior During Therapy Daniel Ramos for tasks assessed/performed             Past Medical History:  Diagnosis Date   History of chicken pox     Past Surgical History:  Procedure Laterality Date   NO PAST SURGERIES      There were no vitals filed for this visit.   Subjective Assessment - 11/10/20 1025     Subjective Pt reports most difficulty with lifting objects away from the body. Exercises have been going ok at home.    Diagnostic tests R shoulder x-ray 10/12/20 - Mild degenerative change without acute bony abnormality.  R shoulder [place] 10/12/20 - Subacromial bursitis and mild AC DJD    Patient Stated Goals "my shoulder to be 100% w/o having to have surgery"    Currently in Pain? No/denies                               [facility] Adult PT Treatment/Exercise - 11/10/20 0001       Exercises   Exercises Shoulder      Shoulder Exercises: Seated   Flexion Right;10 reps;AAROM    Flexion Limitations with wand    Abduction AAROM;Both;10 reps    ABduction Limitations with wand      Shoulder Exercises: Standing   Horizontal ABduction Strengthening;Both;20 reps;Theraband    Theraband Level (Shoulder Horizontal ABduction) Level 2 (Red)    Internal Rotation AAROM;Right;10 reps    Row Strengthening;Both;20 reps;Theraband    Theraband  Level (Shoulder Row) Level 2 (Red)      Shoulder Exercises: Pulleys   ABduction 2 minutes    ABduction Limitations to tolerance      Shoulder Exercises: ROM/Strengthening   UBE (Upper Arm Bike) L1 fwd/ 2 min back      Shoulder Exercises: Stretch   Other Shoulder Stretches manual pec stretches in supine 3x15 sec      Manual Therapy   Manual Therapy Soft tissue mobilization;Myofascial release    Soft tissue mobilization STM to R pecs and anterior deltoid                    PT Education - 11/10/20 1203     Education Details Education on promoting better GH joint mechanics, keeping the shoulders depressed and retracted, keeping palms up when attempting to reach overhead, use of ice after doing HEP or whenever he notices increase in symptoms.    Person(s) Educated Patient    Methods Explanation;Demonstration;Verbal cues    Comprehension Verbalized understanding;Returned demonstration;Verbal cues required;Need further instruction              PT Short Term Goals - 11/10/20 1206  PT SHORT TERM GOAL #1   Title Patient will be independent with initial HEP    Status On-going    Target Date 11/15/20               PT Long Term Goals - 11/10/20 1206       PT LONG TERM GOAL #1   Title Patient will be independent with ongoing/advanced HEP for self-management at home    Status On-going      PT LONG TERM GOAL #2   Title Improve posture and alignment with patient to demonstrate improved upright posture with posterior shoulder girdle engaged    Status On-going      PT LONG TERM GOAL #3   Title Patient to improve R shoulder AROM to WNL without pain provocation    Status On-going      PT LONG TERM GOAL #4   Title Patient will demonstrate improved R shoulder strength to 5/5 w/o pain for functional UE use    Status On-going      PT LONG TERM GOAL #5   Title Patient to report ability to perform ADLs, household, and sport-related activities without  limitation due to R shoulder pain, LOM or weakness    Status On-going                   Plan - 11/10/20 1208     Clinical Impression Statement Patient reported that OH flexion does not bother him that much as opposed to ABD so we focused on this motion with the pulleys. Educated pt on the importance of keeping shoulders in depressed and retracted position at rest to promote normal GH joint mechanics, also instructed him on keeping the thumbs facing upward to open the subacromial space and use of ice prn. He required cues to keep his shoulders depressed to isolate the shoulder joint and prevent shrugging. He does present with tightness in the pecs and anterior deltoid which was address by STM and passive stretching. Pt responded well.    Personal Factors and Comorbidities Time since onset of injury/illness/exacerbation;Past/Current Experience    PT Frequency 2x / week    PT Duration 6 weeks    PT Treatment/Interventions ADLs/Self Care Home Management;Cryotherapy;Moist Heat;Ultrasound;Therapeutic activities;Therapeutic exercise;Neuromuscular re-education;Patient/family education;Manual techniques;Passive range of motion;Dry needling;Taping;Joint Manipulations    PT Next Visit Plan Review initial HEP; postural and RTC strengthening avoiding impingement    PT Home Exercise Plan Access Code: [email or web address]    Consulted and Agree with Plan of Care Patient             Patient will benefit from skilled therapeutic intervention in order to improve the following deficits and impairments:  Decreased activity tolerance, Decreased knowledge of precautions, Decreased range of motion, Decreased strength, Increased fascial restricitons, Increased muscle spasms, Impaired perceived functional ability, Impaired flexibility, Impaired UE functional use, Improper body mechanics, Postural dysfunction, Pain  Visit Diagnosis: Chronic right shoulder pain  Abnormal posture  Stiffness of right shoulder, not  elsewhere classified  Muscle weakness (generalized)     Problem List There are no problems to display for this patient.   Daniel [facility], PTA 11/10/2020, 12:33 PM  [facility] [address]  [address] [place], [place], [address] Phone: [phone]   Fax:  [phone]  Name: Daniel Ramos MRN: [id number] Date of Birth: May 05, 1986

## 2020-11-12 ENCOUNTER — Other Ambulatory Visit: Payer: Self-pay

## 2020-11-12 ENCOUNTER — Ambulatory Visit: Payer: Medicaid Other | Admitting: Physical Therapy

## 2020-11-12 DIAGNOSIS — M25511 Pain in right shoulder: Secondary | ICD-10-CM | POA: Diagnosis not present

## 2020-11-12 DIAGNOSIS — R293 Abnormal posture: Secondary | ICD-10-CM

## 2020-11-12 DIAGNOSIS — M25611 Stiffness of right shoulder, not elsewhere classified: Secondary | ICD-10-CM

## 2020-11-12 DIAGNOSIS — M6281 Muscle weakness (generalized): Secondary | ICD-10-CM

## 2020-11-12 DIAGNOSIS — G8929 Other chronic pain: Secondary | ICD-10-CM

## 2020-11-12 NOTE — Therapy (Signed)
[facility] Outpatient Rehabilitation [facility] [address]  [address] [place], [place], [address] Phone: [phone]   Fax:  [phone]  Physical Therapy Treatment  Patient Details  Name: Daniel Ramos MRN: [id number] Date of Birth: April 23, 1986 Referring Provider (PT): Rodolph Bong, MD   Encounter Date: 11/12/2020   PT End of Session - 11/12/20 0932     Visit Number 3    Number of Visits 13    Date for PT Re-Evaluation 12/13/20    Authorization Type [employer]    PT Start Time 0932    PT Stop Time 1012    PT Time Calculation (min) 40 min    Activity Tolerance Patient tolerated treatment well    Behavior During Therapy [facility] for tasks assessed/performed             Past Medical History:  Diagnosis Date   History of chicken pox     Past Surgical History:  Procedure Laterality Date   NO PAST SURGERIES      There were no vitals filed for this visit.   Subjective Assessment - 11/12/20 0934     Subjective Pt reports difficulty with wand exercises last visit and some increased pain with row progression.    Diagnostic tests R shoulder x-ray 10/12/20 - Mild degenerative change without acute bony abnormality.  R shoulder [place] 10/12/20 - Subacromial bursitis and mild AC DJD    Patient Stated Goals "my shoulder to be 100% w/o having to have surgery"    Currently in Pain? No/denies                               [facility] Adult PT Treatment/Exercise - 11/12/20 0932       Exercises   Exercises Shoulder      Shoulder Exercises: Prone   Extension Both;10 reps;Strengthening    Extension Limitations I's over green Pball    External Rotation Both;10 reps;Strengthening    External Rotation Limitations W's over green Pball    Horizontal ABduction 1 Both;10 reps;Strengthening    Horizontal ABduction 1 Limitations T's over green Pball    Horizontal ABduction 2 Both;10 reps;Strengthening    Horizontal ABduction 2 Limitations Y's over  green Pball      Shoulder Exercises: Standing   Horizontal ABduction Both;10 reps;Strengthening;Theraband   2 sets   Theraband Level (Shoulder Horizontal ABduction) Level 2 (Red)    Horizontal ABduction Limitations standing against pool noodle on wall for tactile cue for scap retraction & depression    External Rotation Both;10 reps;Strengthening;Theraband   2 sets   Theraband Level (Shoulder External Rotation) Level 2 (Red)    External Rotation Limitations standing against pool noodle on wall for tactile cue for scap retraction & depression    ABduction Right;20 reps;AAROM;10 reps;Strengthening;Theraband    Theraband Level (Shoulder ABduction) Level 2 (Red)    ABduction Limitations wand x 20 - VC & TC for shoulder GH depression and scap retraction & depression; red TB 0-90 x 10 reinforcing GH depression and scap retraction & depression   only very mild pain with proper scapular activation   Row Both;20 reps;Strengthening;Theraband    Theraband Level (Shoulder Row) Level 3 (Green)    Row Limitations cues to avoid shoulder shrug, emphasizing scap retraction & depression    Retraction Both;20 reps;Strengthening;Theraband    Theraband Level (Shoulder Retraction) Level 3 (Green)    Retraction Limitations + mini-shoulder extension  Other Standing Exercises Serratus roll-up on 6" FR 2 x 10      Shoulder Exercises: Therapy Ball   Scaption Right;10 reps    Scaption Limitations orange Pball on wall      Shoulder Exercises: ROM/Strengthening   UBE (Upper Arm Bike) L2.0 x 6 min (3' fwd/3' back)    Wall Pushups 10 reps   2 sets                     PT Short Term Goals - 11/12/20 1010       PT SHORT TERM GOAL #1   Title Patient will be independent with initial HEP    Status Achieved   11/12/2020              PT Long Term Goals - 11/10/20 1206       PT LONG TERM GOAL #1   Title Patient will be independent with ongoing/advanced HEP for self-management at home    Status  On-going      PT LONG TERM GOAL #2   Title Improve posture and alignment with patient to demonstrate improved upright posture with posterior shoulder girdle engaged    Status On-going      PT LONG TERM GOAL #3   Title Patient to improve R shoulder AROM to WNL without pain provocation    Status On-going      PT LONG TERM GOAL #4   Title Patient will demonstrate improved R shoulder strength to 5/5 w/o pain for functional UE use    Status On-going      PT LONG TERM GOAL #5   Title Patient to report ability to perform ADLs, household, and sport-related activities without limitation due to R shoulder pain, LOM or weakness    Status On-going                   Plan - 11/12/20 0938     Clinical Impression Statement Daniel Ramos reports issues with red TB horiz ABD added to HEP last visit as well as wand shoulder abduction introduced last visit. Reviewed technique with pt demonstrating tendency for shoulder shrug/hike creating impingement - VC & TC provided for scapular retraction and depression as well as GH depression during wand AAROM with pt noting less pain. Reviewed and progressed scapular strengthening and stabilization to reduce R shoulder impingement with repeated cues for positional and movement awareness avoiding shoulder shrug and focusing on abduction movement in the scapular plane while avoiding reaching to far posteriorly - pt reporting better tolerance, mainly noting sensation of muscle activation with only mild pain at most with some motions. Pt cautioned to avoid pushing exercises and shoulder movements into painful ranges with home activity. No pain at end of session with pt declining need for modalities.    Rehab Potential Good    PT Frequency 2x / week    PT Duration 6 weeks    PT Treatment/Interventions ADLs/Self Care Home Management;Cryotherapy;Moist Heat;Ultrasound;Therapeutic activities;Therapeutic exercise;Neuromuscular re-education;Patient/family education;Manual  techniques;Passive range of motion;Dry needling;Taping;Joint Manipulations    PT Next Visit Plan postural and RTC strengthening avoiding impingement; MT to address abnormal muscle tension or promote normal GH kinematics as indicated    PT Home Exercise Plan Access Code: [email or web address] (6/6)    Consulted and Agree with Plan of Care Patient             Patient will benefit from skilled therapeutic intervention in order to improve the following deficits and impairments:  Decreased activity tolerance,  Decreased knowledge of precautions, Decreased range of motion, Decreased strength, Increased fascial restricitons, Increased muscle spasms, Impaired perceived functional ability, Impaired flexibility, Impaired UE functional use, Improper body mechanics, Postural dysfunction, Pain  Visit Diagnosis: Chronic right shoulder pain  Abnormal posture  Stiffness of right shoulder, not elsewhere classified  Muscle weakness (generalized)     Problem List There are no problems to display for this patient.   Marry Guan, PT, MPT 11/12/2020, 1:07 PM  [facility] [address]  [address] [place], [place], [address] Phone: [phone]   Fax:  [phone]  Name: Daniel Ramos MRN: [id number] Date of Birth: Oct 13, 1985

## 2020-11-16 ENCOUNTER — Ambulatory Visit: Payer: Medicaid Other

## 2020-11-16 ENCOUNTER — Other Ambulatory Visit: Payer: Self-pay

## 2020-11-16 DIAGNOSIS — M25611 Stiffness of right shoulder, not elsewhere classified: Secondary | ICD-10-CM

## 2020-11-16 DIAGNOSIS — R293 Abnormal posture: Secondary | ICD-10-CM

## 2020-11-16 DIAGNOSIS — M25511 Pain in right shoulder: Secondary | ICD-10-CM | POA: Diagnosis not present

## 2020-11-16 DIAGNOSIS — G8929 Other chronic pain: Secondary | ICD-10-CM

## 2020-11-16 DIAGNOSIS — M6281 Muscle weakness (generalized): Secondary | ICD-10-CM

## 2020-11-16 NOTE — Therapy (Signed)
[facility] Outpatient Rehabilitation [facility] [address]  [address] [place], [place], [address] Phone: [phone]   Fax:  [phone]  Physical Therapy Treatment  Patient Details  Name: Daniel Ramos MRN: [id number] Date of Birth: 1985/10/16 Referring Provider (PT): Rodolph Bong, MD   Encounter Date: 11/16/2020   PT End of Session - 11/16/20 1059     Visit Number 4    Number of Visits 13    Date for PT Re-Evaluation 12/13/20    Authorization Type [employer]    PT Start Time 1013    PT Stop Time 1056    PT Time Calculation (min) 43 min    Activity Tolerance Patient tolerated treatment well    Behavior During Therapy [facility] for tasks assessed/performed             Past Medical History:  Diagnosis Date   History of chicken pox     Past Surgical History:  Procedure Laterality Date   NO PAST SURGERIES      There were no vitals filed for this visit.   Subjective Assessment - 11/16/20 1015     Subjective Pt notes that he is doing good, the reverse fly motion for him seems to be the most challenging.    Diagnostic tests R shoulder x-ray 10/12/20 - Mild degenerative change without acute bony abnormality.  R shoulder [place] 10/12/20 - Subacromial bursitis and mild AC DJD    Patient Stated Goals "my shoulder to be 100% w/o having to have surgery"    Currently in Pain? No/denies                               [facility] Adult PT Treatment/Exercise - 11/16/20 0001       Shoulder Exercises: Prone   Retraction Strengthening;Right;10 reps;Weights    Retraction Weight (lbs) 5    Horizontal ABduction 1 Strengthening;Right;10 reps;Weights    Horizontal ABduction 1 Weight (lbs) 5    Other Prone Exercises Ys with 5# weight 10 reps      Shoulder Exercises: Standing   Horizontal ABduction Strengthening;Both;Theraband;20 reps    Theraband Level (Shoulder Horizontal ABduction) Level 2 (Red)    Horizontal ABduction Limitations standing  against pool noodle on wall for tactile cue for scap retraction & depression    External Rotation Strengthening;Right;Theraband;20 reps    Theraband Level (Shoulder External Rotation) Level 2 (Red)    Flexion Strengthening;Right;10 reps;Theraband    Theraband Level (Shoulder Flexion) Level 2 (Red)    ABduction Strengthening;Right;10 reps;Theraband    Theraband Level (Shoulder ABduction) Level 2 (Red)    Row Strengthening;Right;10 reps;Weights    Row Weight (lbs) 10    Row Limitations 2x10; bent over row at counter    Other Standing Exercises B ER with red TB 10 reps    Other Standing Exercises R standing scaption with red TB 10 reps      Shoulder Exercises: ROM/Strengthening   UBE (Upper Arm Bike) L2.0 x 6 min (3' fwd/3' back)    Cybex Row Limitations 20# 2x10 reps    Wall Pushups 10 reps    Wall Pushups Limitations heavy cues for technique      Shoulder Exercises: Stretch   Corner Stretch 2 reps;30 seconds    Corner Stretch Limitations 90 deg at doorway                    PT Education - 11/16/20  1058     Education Details HEP update: Access Code: [email or web address]    Person(s) Educated Patient    Methods Explanation;Demonstration;Verbal cues;Tactile cues;Handout    Comprehension Verbalized understanding;Returned demonstration;Verbal cues required;Tactile cues required              PT Short Term Goals - 11/12/20 1010       PT SHORT TERM GOAL #1   Title Patient will be independent with initial HEP    Status Achieved   11/12/2020              PT Long Term Goals - 11/10/20 1206       PT LONG TERM GOAL #1   Title Patient will be independent with ongoing/advanced HEP for self-management at home    Status On-going      PT LONG TERM GOAL #2   Title Improve posture and alignment with patient to demonstrate improved upright posture with posterior shoulder girdle engaged    Status On-going      PT LONG TERM GOAL #3   Title Patient to improve R shoulder AROM to  WNL without pain provocation    Status On-going      PT LONG TERM GOAL #4   Title Patient will demonstrate improved R shoulder strength to 5/5 w/o pain for functional UE use    Status On-going      PT LONG TERM GOAL #5   Title Patient to report ability to perform ADLs, household, and sport-related activities without limitation due to R shoulder pain, LOM or weakness    Status On-going                   Plan - 11/16/20 1100     Clinical Impression Statement Pt demonstrates a good performance of the exercises with occasional cues to keep shoulders depressed during exercises. Focused session on periscap and RTC strengthening today to facilitate better aligment of the [facility] joint. Heavy cues required during wall push ups for correct form rolling the shoulders foward instead of pushing hips back. Added exercises to HEP today focusing on periscap strenthening for carryover at home. Also instruction needed to fully engage posterior shoulder girdle muscles with retracting motions. Pt responded well to treatment.    Personal Factors and Comorbidities Time since onset of injury/illness/exacerbation;Past/Current Experience    PT Frequency 2x / week    PT Duration 6 weeks    PT Treatment/Interventions ADLs/Self Care Home Management;Cryotherapy;Moist Heat;Ultrasound;Therapeutic activities;Therapeutic exercise;Neuromuscular re-education;Patient/family education;Manual techniques;Passive range of motion;Dry needling;Taping;Joint Manipulations    PT Next Visit Plan postural and RTC strengthening avoiding impingement; MT to address abnormal muscle tension or promote normal GH kinematics as indicated    PT Home Exercise Plan Access Code: [email or web address] (6/6), Access Code: [email or web address] (6/21)             Patient will benefit from skilled therapeutic intervention in order to improve the following deficits and impairments:  Decreased activity tolerance, Decreased knowledge of precautions, Decreased range of  motion, Decreased strength, Increased fascial restricitons, Increased muscle spasms, Impaired perceived functional ability, Impaired flexibility, Impaired UE functional use, Improper body mechanics, Postural dysfunction, Pain  Visit Diagnosis: Chronic right shoulder pain  Abnormal posture  Stiffness of right shoulder, not elsewhere classified  Muscle weakness (generalized)     Problem List There are no problems to display for this patient.   Darleene Cleaver, PTA 11/16/2020, 11:12 AM  [facility] Outpatient Rehabilitation [facility] [address]  [address] [place],  [place], [address] Phone: [phone]   Fax:  [phone]  Name: Daniel Ramos MRN: [id number] Date of Birth: [birth year]

## 2020-11-18 ENCOUNTER — Ambulatory Visit: Payer: Medicaid Other | Admitting: Physical Therapy

## 2020-11-18 ENCOUNTER — Other Ambulatory Visit: Payer: Self-pay

## 2020-11-18 ENCOUNTER — Encounter: Payer: Self-pay | Admitting: Physical Therapy

## 2020-11-18 DIAGNOSIS — M25611 Stiffness of right shoulder, not elsewhere classified: Secondary | ICD-10-CM

## 2020-11-18 DIAGNOSIS — M25511 Pain in right shoulder: Secondary | ICD-10-CM | POA: Diagnosis not present

## 2020-11-18 DIAGNOSIS — R293 Abnormal posture: Secondary | ICD-10-CM

## 2020-11-18 DIAGNOSIS — M6281 Muscle weakness (generalized): Secondary | ICD-10-CM

## 2020-11-18 DIAGNOSIS — G8929 Other chronic pain: Secondary | ICD-10-CM

## 2020-11-18 NOTE — Therapy (Signed)
[facility] [address]  [place], [place], [address] Phone: [phone]   Fax:  [phone]  Physical Therapy Treatment  Patient Details  Name: Daniel Ramos MRN: [id number] Date of Birth: 11/12/1985 Referring Provider (PT): Gregor Hams, MD   Encounter Date: 11/18/2020   PT End of Session - 11/18/20 1021     Visit Number 5    Number of Visits 13    Date for PT Re-Evaluation 12/13/20    Authorization Type [employer]    PT Start Time 1021    PT Stop Time 1103    PT Time Calculation (min) 42 min    Activity Tolerance Patient tolerated treatment well    Behavior During Therapy [facility] for tasks assessed/performed             Past Medical History:  Diagnosis Date   History of chicken pox     Past Surgical History:  Procedure Laterality Date   NO PAST SURGERIES      There were no vitals filed for this visit.   Subjective Assessment - 11/18/20 1022     Subjective Pt doing well with the new exercises - notes he feels the muscle burn which makes him feel like he is actually getting somewhere.    Diagnostic tests R shoulder x-ray 10/12/20 - Mild degenerative change without acute bony abnormality.  R shoulder [place] 10/12/20 - Subacromial bursitis and mild AC DJD    Patient Stated Goals "my shoulder to be 100% w/o having to have surgery"    Currently in Pain? No/denies    Pain Score 0-No pain   up to 8/10 with some overhead motions   Pain Location Shoulder    Pain Orientation Right;Anterior    Pain Descriptors / Indicators Sharp   "quick"   Pain Type Chronic pain    Pain Frequency Intermittent                               [facility] Adult PT Treatment/Exercise - 11/18/20 1021       Exercises   Exercises Shoulder      Shoulder Exercises: Prone   Other Prone Exercises Serratus rocks on inverted BOSU x 20   prone with upper body hanging over edge of mat table     Shoulder Exercises:  Standing   Flexion Right;10 reps;Strengthening;Weights    Shoulder Flexion Weight (lbs) 6    Flexion Limitations PT facilitating scapular motion    ABduction Right;10 reps;Strengthening;Weights    Shoulder ABduction Weight (lbs) 6    ABduction Limitations Scaption - PT facilitating scapular motion    Row Both;15 reps;Strengthening    Row Limitations TRX low & mid row    Diagonals Right;10 reps;Strengthening;Theraband    Theraband Level (Shoulder Diagonals) Level 3 (Green)    Diagonals Limitations D1/D2 flexion & extension    Other Standing Exercises Serratus roll-up on 6" FR 2 x 10 with looped red TB at distal forearms    Other Standing Exercises B scap retraction & depression with green TB draped over shoulder and crossed behind back with hands pressing down and out in extension x 20      Shoulder Exercises: ROM/Strengthening   UBE (Upper Arm Bike) L3.0 x 6 min (3' fwd/3' back)                      PT Short Term Goals -  11/12/20 1010       PT SHORT TERM GOAL #1   Title Patient will be independent with initial HEP    Status Achieved   11/12/2020              PT Long Term Goals - 11/18/20 1025       PT LONG TERM GOAL #1   Title Patient will be independent with ongoing/advanced HEP for self-management at home    Status Partially Met    Target Date 12/13/20      PT LONG TERM GOAL #2   Title Improve posture and alignment with patient to demonstrate improved upright posture with posterior shoulder girdle engaged    Status On-going    Target Date 12/13/20      PT LONG TERM GOAL #3   Title Patient to improve R shoulder AROM to WNL without pain provocation    Status On-going    Target Date 12/13/20      PT LONG TERM GOAL #4   Title Patient will demonstrate improved R shoulder strength to 5/5 w/o pain for functional UE use    Status On-going    Target Date 12/13/20      PT LONG TERM GOAL #5   Title Patient to report ability to perform ADLs, household, and  sport-related activities without limitation due to R shoulder pain, LOM or weakness    Status On-going    Target Date 12/13/20                   Plan - 11/18/20 1026     Clinical Impression Statement Daniel Ramos reports no issues with recent HEP update and notes feeling of good muscle workout. Pain still present intermittently with some overhead motions but decreasing in intensity. Continued emphasis on scapular stabilization and strengthening adding more resistance and facilitating scapular motion for improved GH rhythm with pt noting less of painful arc with flexion and scaption motions. Encourage carryover of scapular engagement with movement patterns in daily lifting and reaching activities. Daniel Ramos may benefit from simulated lifting tasks and deltoid strengthening to help with everyday activities.    Rehab Potential Good    PT Frequency 2x / week    PT Duration 6 weeks    PT Treatment/Interventions ADLs/Self Care Home Management;Cryotherapy;Moist Heat;Ultrasound;Therapeutic activities;Therapeutic exercise;Neuromuscular re-education;Patient/family education;Manual techniques;Passive range of motion;Dry needling;Taping;Joint Manipulations    PT Next Visit Plan postural and RTC strengthening avoiding impingement; deltoid strengthening with good shoulder alignment; lifting simulation address proper mechanics; MT to address abnormal muscle tension or promote normal GH kinematics as indicated    PT Home Exercise Plan Access Codes: [email or web address] (6/6), [email or web address] (6/21)    Consulted and Agree with Plan of Care Patient             Patient will benefit from skilled therapeutic intervention in order to improve the following deficits and impairments:  Decreased activity tolerance, Decreased knowledge of precautions, Decreased range of motion, Decreased strength, Increased fascial restricitons, Increased muscle spasms, Impaired perceived functional ability, Impaired flexibility, Impaired UE functional use,  Improper body mechanics, Postural dysfunction, Pain  Visit Diagnosis: Chronic right shoulder pain  Abnormal posture  Stiffness of right shoulder, not elsewhere classified  Muscle weakness (generalized)     Problem List There are no problems to display for this patient.   Percival Spanish, PT, MPT 11/18/2020, 12:17 PM  [facility] [address]  [place], [place], [address] Phone: [phone]  Fax:  [phone]  Name: Daniel Ramos MRN: [id number] Date of Birth: 1985/07/01

## 2020-11-23 ENCOUNTER — Ambulatory Visit: Payer: Medicaid Other

## 2020-11-23 ENCOUNTER — Other Ambulatory Visit: Payer: Self-pay

## 2020-11-23 DIAGNOSIS — M25511 Pain in right shoulder: Secondary | ICD-10-CM | POA: Diagnosis not present

## 2020-11-23 DIAGNOSIS — M6281 Muscle weakness (generalized): Secondary | ICD-10-CM

## 2020-11-23 DIAGNOSIS — M25611 Stiffness of right shoulder, not elsewhere classified: Secondary | ICD-10-CM

## 2020-11-23 DIAGNOSIS — G8929 Other chronic pain: Secondary | ICD-10-CM

## 2020-11-23 DIAGNOSIS — R293 Abnormal posture: Secondary | ICD-10-CM

## 2020-11-23 NOTE — Therapy (Signed)
[facility] [address]  [place], [place], [address] Phone: [phone]   Fax:  [phone]  Physical Therapy Treatment  Patient Details  Name: Daniel Ramos MRN: [id number] Date of Birth: November 20, 1985 Referring Provider (PT): Gregor Hams, MD   Encounter Date: 11/23/2020   PT End of Session - 11/23/20 1056     Visit Number 6    Number of Visits 13    Date for PT Re-Evaluation 12/13/20    Authorization Type [employer]    PT Start Time 1018    PT Stop Time 1056    PT Time Calculation (min) 38 min    Activity Tolerance Patient tolerated treatment well    Behavior During Therapy WFL for tasks assessed/performed             Past Medical History:  Diagnosis Date   History of chicken pox     Past Surgical History:  Procedure Laterality Date   NO PAST SURGERIES      There were no vitals filed for this visit.   Subjective Assessment - 11/23/20 1021     Subjective Notes improvements with getting out of bed with R UE bracing and lifting the R UE OH.    Diagnostic tests R shoulder x-ray 10/12/20 - Mild degenerative change without acute bony abnormality.  R shoulder [place] 10/12/20 - Subacromial bursitis and mild AC DJD    Patient Stated Goals "my shoulder to be 100% w/o having to have surgery"    Currently in Pain? No/denies                [facility] PT Assessment - 11/23/20 0001       AROM   Right Shoulder Flexion 165 Degrees    Right Shoulder ABduction 167 Degrees    Right Shoulder Internal Rotation 89 Degrees    Right Shoulder External Rotation 87 Degrees                           [facility] Adult PT Treatment/Exercise - 11/23/20 0001       Exercises   Exercises Shoulder      Shoulder Exercises: Standing   Diagonals Limitations R D2 flexion with 6# weight 10 reps      Shoulder Exercises: ROM/Strengthening   UBE (Upper Arm Bike) L3.0 x 6 min (3' fwd/3' back)    Lat Pull  Limitations standing shoulder extensions; 25# 2x10 reps; seated lat pull with 35# 10 reps    Cybex Row Limitations 25#, 30# 2x10 reps   10 reps with each weight   Ball on Wall up/down, side to side, circles both ways with beach ball 10x each    Other ROM/Strengthening Exercises serratus slide with red TB 2x10 on wall                      PT Short Term Goals - 11/12/20 1010       PT SHORT TERM GOAL #1   Title Patient will be independent with initial HEP    Status Achieved   11/12/2020              PT Long Term Goals - 11/23/20 1039       PT LONG TERM GOAL #1   Title Patient will be independent with ongoing/advanced HEP for self-management at home    Status Partially Met      PT LONG TERM GOAL #  2   Title Improve posture and alignment with patient to demonstrate improved upright posture with posterior shoulder girdle engaged    Status On-going      PT LONG TERM GOAL #3   Title Patient to improve R shoulder AROM to WNL without pain provocation    Status Partially Met   no pain with measured motions but pain when having to reach and grab objects into ABD     PT LONG TERM GOAL #4   Title Patient will demonstrate improved R shoulder strength to 5/5 w/o pain for functional UE use    Status On-going      PT LONG TERM GOAL #5   Title Patient to report ability to perform ADLs, household, and sport-related activities without limitation due to R shoulder pain, LOM or weakness    Status On-going   has not returned to working out but notes improvement with R UE use with getting out of bed                  Plan - 11/23/20 1058     Clinical Impression Statement Pt has been doing good with the HEP and showing progress so far. He demonstrates R shoulder AROM WFL but notes pain with lifting certain objects in the abduction plane. He notes improvements with R UE bracing with getting out of bed. Encouraged pt to began trying exercises at the gym after trying machines  with him today but reminded him not to attempt lifting heavy weights and focus on R shoulder muscle recruitment. Mild reports of pain with the resisted ABD but reduced with thumb facing upwards. He showed a good demonstration of the exercises and responded well to treatment.    Personal Factors and Comorbidities Time since onset of injury/illness/exacerbation;Past/Current Experience    PT Frequency 2x / week    PT Duration 6 weeks    PT Treatment/Interventions ADLs/Self Care Home Management;Cryotherapy;Moist Heat;Ultrasound;Therapeutic activities;Therapeutic exercise;Neuromuscular re-education;Patient/family education;Manual techniques;Passive range of motion;Dry needling;Taping;Joint Manipulations    PT Next Visit Plan postural and RTC strengthening avoiding impingement; deltoid strengthening with good shoulder alignment; lifting simulation address proper mechanics; MT to address abnormal muscle tension or promote normal GH kinematics as indicated    PT Home Exercise Plan Access Codes: [email or web address] (6/6), [email or web address] (6/21)    Consulted and Agree with Plan of Care Patient             Patient will benefit from skilled therapeutic intervention in order to improve the following deficits and impairments:  Decreased activity tolerance, Decreased knowledge of precautions, Decreased range of motion, Decreased strength, Increased fascial restricitons, Increased muscle spasms, Impaired perceived functional ability, Impaired flexibility, Impaired UE functional use, Improper body mechanics, Postural dysfunction, Pain  Visit Diagnosis: Chronic right shoulder pain  Abnormal posture  Stiffness of right shoulder, not elsewhere classified  Muscle weakness (generalized)     Problem List There are no problems to display for this patient.   Artist Pais, PTA 11/23/2020, 12:04 PM  [facility] [address]  [place], [place],  [address] Phone: [phone]   Fax:  [phone]  Name: Juan Kissoon MRN: [id number] Date of Birth: 1985-08-19

## 2020-11-25 ENCOUNTER — Ambulatory Visit: Payer: Medicaid Other

## 2020-11-25 ENCOUNTER — Other Ambulatory Visit: Payer: Self-pay

## 2020-11-25 DIAGNOSIS — M25511 Pain in right shoulder: Secondary | ICD-10-CM

## 2020-11-25 DIAGNOSIS — R293 Abnormal posture: Secondary | ICD-10-CM

## 2020-11-25 DIAGNOSIS — G8929 Other chronic pain: Secondary | ICD-10-CM

## 2020-11-25 DIAGNOSIS — M6281 Muscle weakness (generalized): Secondary | ICD-10-CM

## 2020-11-25 DIAGNOSIS — M25611 Stiffness of right shoulder, not elsewhere classified: Secondary | ICD-10-CM

## 2020-11-25 NOTE — Therapy (Signed)
[facility] [address]  [place], [place], [address] Phone: [phone]   Fax:  [phone]  Physical Therapy Treatment  Patient Details  Name: Daniel Ramos MRN: [id number] Date of Birth: [birth year] Referring Provider (PT): Gregor Hams, MD   Encounter Date: 11/25/2020   PT End of Session - 11/25/20 1058     Visit Number 7    Number of Visits 13    Date for PT Re-Evaluation 12/13/20    Authorization Type [employer]    PT Start Time 1017    PT Stop Time 1056    PT Time Calculation (min) 39 min    Activity Tolerance Patient tolerated treatment well    Behavior During Therapy [facility] for tasks assessed/performed             Past Medical History:  Diagnosis Date   History of chicken pox     Past Surgical History:  Procedure Laterality Date   NO PAST SURGERIES      There were no vitals filed for this visit.   Subjective Assessment - 11/25/20 1019     Subjective Shoulder is doing about the same nothing worse.    Diagnostic tests R shoulder x-ray 10/12/20 - Mild degenerative change without acute bony abnormality.  R shoulder [place] 10/12/20 - Subacromial bursitis and mild AC DJD    Patient Stated Goals "my shoulder to be 100% w/o having to have surgery"    Currently in Pain? No/denies                               [facility] Adult PT Treatment/Exercise - 11/25/20 0001       Shoulder Exercises: Prone   Horizontal ABduction 1 Strengthening;Both;20 reps;Weights    Horizontal ABduction 1 Weight (lbs) 4    Horizontal ABduction 1 Limitations orange pball underneath patient    Other Prone Exercises Ys with 4# weights, orange pball underneath patient, 2x10      Shoulder Exercises: Sidelying   External Rotation Strengthening;Right;10 reps;Weights    External Rotation Weight (lbs) 3    External Rotation Limitations cues for 90 deg at elbow      Shoulder Exercises: Standing   External  Rotation Strengthening;Right;10 reps;Theraband    Theraband Level (Shoulder External Rotation) Level 1 (Yellow)    External Rotation Limitations at 90/90 position      Shoulder Exercises: ROM/Strengthening   UBE (Upper Arm Bike) L4.0 x 6 min (3' fwd/3' back)    Lat Pull Limitations standing shoulder extensions; 35# 2x10 reps;    Cybex Row Limitations 35# 2x10 reps    Wall Pushups 20 reps    Wall Pushups Limitations 2x10 with orange pball    Other ROM/Strengthening Exercises ER at 90/90 position with green ball 15 reps    Other ROM/Strengthening Exercises push ups with organe pball on mat table for increased resistance 10 reps                      PT Short Term Goals - 11/12/20 1010       PT SHORT TERM GOAL #1   Title Patient will be independent with initial HEP    Status Achieved   11/12/2020              PT Long Term Goals - 11/23/20 1039       PT LONG TERM GOAL #1  Title Patient will be independent with ongoing/advanced HEP for self-management at home    Status Partially Met      PT LONG TERM GOAL #2   Title Improve posture and alignment with patient to demonstrate improved upright posture with posterior shoulder girdle engaged    Status On-going      PT LONG TERM GOAL #3   Title Patient to improve R shoulder AROM to WNL without pain provocation    Status Partially Met   no pain with measured motions but pain when having to reach and grab objects into ABD     PT LONG TERM GOAL #4   Title Patient will demonstrate improved R shoulder strength to 5/5 w/o pain for functional UE use    Status On-going      PT LONG TERM GOAL #5   Title Patient to report ability to perform ADLs, household, and sport-related activities without limitation due to R shoulder pain, LOM or weakness    Status On-going   has not returned to working out but notes improvement with R UE use with getting out of bed                  Plan - 11/25/20 1059     Clinical  Impression Statement Pt has continued to do well with home exercises and reporting no pain arriving to sessions. He did report having trouble with throwing footballs and baseballs with his sons. These throwing motions are with the shoulder in 90/90 (90 deg abduction/90 deg elbow flexion). Increased ER resistance with shoulder and elbow at 90/90 position to strengthen the R shoulder in these positions. Also continued working on scap stab with good form but TC needed to fully engage scap retractors at times. Overall he responded well but would continue to benefit working on throwing motions and possibly box carrying.    Personal Factors and Comorbidities Time since onset of injury/illness/exacerbation;Past/Current Experience    PT Frequency 2x / week    PT Duration 6 weeks    PT Treatment/Interventions ADLs/Self Care Home Management;Cryotherapy;Moist Heat;Ultrasound;Therapeutic activities;Therapeutic exercise;Neuromuscular re-education;Patient/family education;Manual techniques;Passive range of motion;Dry needling;Taping;Joint Manipulations    PT Next Visit Plan Practice RTC strengthening in 90/90 position to help with throwing motions, lifting simulation address proper mechanics; periscap strengthening; MT to address abnormal muscle tension or promote normal GH kinematics as indicated    PT Home Exercise Plan Access Codes: [email or web address] (11/04/2022), [email or web address] (6/21)    Consulted and Agree with Plan of Care Patient             Patient will benefit from skilled therapeutic intervention in order to improve the following deficits and impairments:  Decreased activity tolerance, Decreased knowledge of precautions, Decreased range of motion, Decreased strength, Increased fascial restricitons, Increased muscle spasms, Impaired perceived functional ability, Impaired flexibility, Impaired UE functional use, Improper body mechanics, Postural dysfunction, Pain  Visit Diagnosis: Chronic right shoulder pain  Abnormal  posture  Stiffness of right shoulder, not elsewhere classified  Muscle weakness (generalized)     Problem List There are no problems to display for this patient.   Artist Pais, PTA 11/25/2020, 12:09 PM  [facility] [address]  [place], [place], [address] Phone: [phone]   Fax:  [phone]  Name: Daniel Ramos MRN: [id number] Date of Birth: 08-02-1985

## 2020-11-30 ENCOUNTER — Encounter: Payer: Self-pay | Admitting: Physical Therapy

## 2020-11-30 ENCOUNTER — Ambulatory Visit: Payer: Medicaid Other | Attending: Family Medicine | Admitting: Physical Therapy

## 2020-11-30 ENCOUNTER — Other Ambulatory Visit: Payer: Self-pay

## 2020-11-30 DIAGNOSIS — M6281 Muscle weakness (generalized): Secondary | ICD-10-CM | POA: Diagnosis present

## 2020-11-30 DIAGNOSIS — R293 Abnormal posture: Secondary | ICD-10-CM

## 2020-11-30 DIAGNOSIS — M25611 Stiffness of right shoulder, not elsewhere classified: Secondary | ICD-10-CM | POA: Insufficient documentation

## 2020-11-30 DIAGNOSIS — G8929 Other chronic pain: Secondary | ICD-10-CM | POA: Diagnosis present

## 2020-11-30 DIAGNOSIS — M25511 Pain in right shoulder: Secondary | ICD-10-CM | POA: Diagnosis not present

## 2020-11-30 NOTE — Therapy (Signed)
[facility] [address]  [place], [place], [address] Phone: [phone]   Fax:  [phone]  Physical Therapy Treatment  Patient Details  Name: Daniel Ramos MRN: [id number] Date of Birth: [birth year] Referring Provider (PT): Gregor Hams, MD   Encounter Date: 11/30/2020   PT End of Session - 11/30/20 1018     Visit Number 8    Number of Visits 13    Date for PT Re-Evaluation 12/17/20    Authorization Type [facility] Medicaid    Authorization Time Period 11/02/20 - 12/17/20    Authorization - Visit Number 7    Authorization - Number of Visits 12   +2 re-evals   PT Start Time 7048    PT Stop Time 1102    PT Time Calculation (min) 44 min    Activity Tolerance Patient tolerated treatment well    Behavior During Therapy WFL for tasks assessed/performed             Past Medical History:  Diagnosis Date   History of chicken pox     Past Surgical History:  Procedure Laterality Date   NO PAST SURGERIES      There were no vitals filed for this visit.   Subjective Assessment - 11/30/20 1022     Subjective Pain remains only with certain motions.    Diagnostic tests R shoulder x-ray 10/12/20 - Mild degenerative change without acute bony abnormality.  R shoulder [place] 10/12/20 - Subacromial bursitis and mild AC DJD    Patient Stated Goals "my shoulder to be 100% w/o having to have surgery"    Currently in Pain? No/denies                               [facility] Adult PT Treatment/Exercise - 11/30/20 1018       Exercises   Exercises Shoulder      Shoulder Exercises: Standing   Protraction Both;15 reps;Strengthening;Theraband    Theraband Level (Shoulder Protraction) Level 3 (Green)    External Rotation Right;15 reps;Strengthening;Theraband   2 sets   Theraband Level (Shoulder External Rotation) Level 1 (Yellow)    External Rotation Limitations 90/90 position    Internal Rotation Right;15  reps;Strengthening;Theraband   2 sets   Theraband Level (Shoulder Internal Rotation) Level 1 (Yellow)    Internal Rotation Limitations 90/90 position    Row Both;15 reps;Strengthening    Row Limitations TRX mid row & mid row + ER    Diagonals Right;10 reps;Strengthening;Theraband    Theraband Level (Shoulder Diagonals) Level 3 (Green)    Diagonals Limitations D1/D2 flexion & extension    Other Standing Exercises Red TB deltoid shrugs x 15    Other Standing Exercises B scap retraction & depression with green TB draped over shoulder and crossed behind back with hands pressing down and out in extension & ER x 20      Shoulder Exercises: ROM/Strengthening   UBE (Upper Arm Bike) L4.0 x 6 min (3' fwd/3' back)    Lat Pull Limitations standing shoulder extensions; 5# 2x10 reps; seated lat pull with 35# 2 x 10 reps   cues to maintain shoulder depression   "W" Arms Green TB "W" rows 2 x 10                      PT Short Term Goals - 11/12/20 1010  PT SHORT TERM GOAL #1   Title Patient will be independent with initial HEP    Status Achieved   11/12/2020              PT Long Term Goals - 11/23/20 1039       PT LONG TERM GOAL #1   Title Patient will be independent with ongoing/advanced HEP for self-management at home    Status Partially Met      PT LONG TERM GOAL #2   Title Improve posture and alignment with patient to demonstrate improved upright posture with posterior shoulder girdle engaged    Status On-going      PT LONG TERM GOAL #3   Title Patient to improve R shoulder AROM to WNL without pain provocation    Status Partially Met   no pain with measured motions but pain when having to reach and grab objects into ABD     PT LONG TERM GOAL #4   Title Patient will demonstrate improved R shoulder strength to 5/5 w/o pain for functional UE use    Status On-going      PT LONG TERM GOAL #5   Title Patient to report ability to perform ADLs, household, and  sport-related activities without limitation due to R shoulder pain, LOM or weakness    Status On-going   has not returned to working out but notes improvement with R UE use with getting out of bed                  Plan - 11/30/20 1024     Clinical Impression Statement Daniel Ramos feels that the therapy has been helping but still notes issues when lifting with R arm with hand away from body. Continued cues still necessary at times to keep shoulders/scapula retracted and depressed (avoiding shoulder shrug) during exercises but pt noting improving tolerance for 90/90 RTC strengthening. Cues also necessary to reduce trunk motion during UE diagonals and focus on isolating shoulder and scapular motion. Will plan to address lifting mechanics in upcoming visits.    Personal Factors and Comorbidities --    Rehab Potential Good    PT Frequency 2x / week    PT Duration 6 weeks    PT Treatment/Interventions ADLs/Self Care Home Management;Cryotherapy;Moist Heat;Ultrasound;Therapeutic activities;Therapeutic exercise;Neuromuscular re-education;Patient/family education;Manual techniques;Passive range of motion;Dry needling;Taping;Joint Manipulations    PT Next Visit Plan Practice RTC strengthening in 90/90 position to help with throwing motions, lifting simulation address proper mechanics; periscap strengthening; MT to address abnormal muscle tension or promote normal GH kinematics as indicated    PT Home Exercise Plan Access Codes: [email or web address] (11-15-22), [email or web address] (6/21)    Consulted and Agree with Plan of Care Patient             Patient will benefit from skilled therapeutic intervention in order to improve the following deficits and impairments:  Decreased activity tolerance, Decreased knowledge of precautions, Decreased range of motion, Decreased strength, Increased fascial restricitons, Increased muscle spasms, Impaired perceived functional ability, Impaired flexibility, Impaired UE functional use, Improper  body mechanics, Postural dysfunction, Pain  Visit Diagnosis: Chronic right shoulder pain  Abnormal posture  Stiffness of right shoulder, not elsewhere classified  Muscle weakness (generalized)     Problem List There are no problems to display for this patient.   Percival Spanish, PT, MPT 11/30/2020, 11:30 AM  [facility] [address]  [place], [place], [address] Phone: [phone]   Fax:  [phone]  Name: Daniel Ramos MRN: [id number] Date of Birth: 04/10/1986

## 2020-12-02 ENCOUNTER — Ambulatory Visit: Payer: Medicaid Other | Admitting: Physical Therapy

## 2020-12-02 ENCOUNTER — Other Ambulatory Visit: Payer: Self-pay

## 2020-12-02 ENCOUNTER — Encounter: Payer: Self-pay | Admitting: Physical Therapy

## 2020-12-02 DIAGNOSIS — G8929 Other chronic pain: Secondary | ICD-10-CM

## 2020-12-02 DIAGNOSIS — M25511 Pain in right shoulder: Secondary | ICD-10-CM | POA: Diagnosis not present

## 2020-12-02 DIAGNOSIS — M6281 Muscle weakness (generalized): Secondary | ICD-10-CM

## 2020-12-02 DIAGNOSIS — R293 Abnormal posture: Secondary | ICD-10-CM

## 2020-12-02 DIAGNOSIS — M25611 Stiffness of right shoulder, not elsewhere classified: Secondary | ICD-10-CM

## 2020-12-02 NOTE — Therapy (Signed)
[facility] [address]  [place], [place], [address] Phone: [phone]   Fax:  [phone]  Physical Therapy Treatment  Patient Details  Name: Daniel Ramos MRN: [id number] Date of Birth: [birth year] Referring Provider (PT): Gregor Hams, MD   Encounter Date: 12/02/2020   PT End of Session - 12/02/20 1017     Visit Number 9    Number of Visits 13    Date for PT Re-Evaluation 12/17/20    Authorization Type Healthy [facility] Medicaid    Authorization Time Period 11/02/20 - 12/17/20    Authorization - Visit Number 8    Authorization - Number of Visits 12   +2 re-evals   PT Start Time 1017    PT Stop Time 1101    PT Time Calculation (min) 44 min    Activity Tolerance Patient tolerated treatment well    Behavior During Therapy WFL for tasks assessed/performed             Past Medical History:  Diagnosis Date   History of chicken pox     Past Surgical History:  Procedure Laterality Date   NO PAST SURGERIES      There were no vitals filed for this visit.   Subjective Assessment - 12/02/20 1019     Subjective Pt denies nay new issues with his shoulder.    Diagnostic tests R shoulder x-ray 10/12/20 - Mild degenerative change without acute bony abnormality.  R shoulder [place] 10/12/20 - Subacromial bursitis and mild AC DJD    Patient Stated Goals "my shoulder to be 100% w/o having to have surgery"    Currently in Pain? No/denies                               [facility] Adult PT Treatment/Exercise - 12/02/20 1017       Exercises   Exercises Shoulder      Shoulder Exercises: Prone   Extension Both;10 reps;Strengthening;Weights   2 sets   Extension Weight (lbs) 4    Extension Limitations I's over green Pball    External Rotation Both;10 reps;Strengthening;Weights    External Rotation Weight (lbs) 4    External Rotation Limitations W's over green Pball    Horizontal ABduction 1 Both;10  reps;Strengthening    Horizontal ABduction 1 Weight (lbs) 4    Horizontal ABduction 1 Limitations T's over green Pball    Horizontal ABduction 2 Both;10 reps;Strengthening    Horizontal ABduction 2 Weight (lbs) 4    Horizontal ABduction 2 Limitations Y's over green Pball    Other Prone Exercises Fitter (1 black/1 blue) horiz ABD/ADD from plank position & flexion/extension from plank to knees x 15 each   mild pain (3/10) with flexion & extension     Shoulder Exercises: Standing   Row Both;15 reps;Strengthening    Row Limitations TRX mid row + ER    Other Standing Exercises B deltoid shrug/shoulder ABD 5# with flexed elbows  x 15 - cues to avoid shoulder shrug    Other Standing Exercises R shoulder depression with green TB x 15      Shoulder Exercises: ROM/Strengthening   UBE (Upper Arm Bike) L4.0 x 6 min (3' fwd/3' back)    Wall Pushups 15 reps    Wall Pushups Limitations TRX pushups      Manual Therapy   Manual Therapy Joint mobilization    Joint Mobilization R shoulder grade  III-IV distraction, inferior & A/P glides - better abuction tolerance following jt mobs                      PT Short Term Goals - 11/12/20 1010       PT SHORT TERM GOAL #1   Title Patient will be independent with initial HEP    Status Achieved   11/12/2020              PT Long Term Goals - 12/02/20 1021       PT LONG TERM GOAL #1   Title Patient will be independent with ongoing/advanced HEP for self-management at home    Status Partially Met    Target Date 12/13/20      PT LONG TERM GOAL #2   Title Improve posture and alignment with patient to demonstrate improved upright posture with posterior shoulder girdle engaged    Status Partially Met    Target Date 12/13/20      PT LONG TERM GOAL #3   Title Patient to improve R shoulder AROM to WNL without pain provocation    Status Achieved   11/23/20     PT LONG TERM GOAL #4   Title Patient will demonstrate improved R shoulder  strength to 5/5 w/o pain for functional UE use    Status On-going    Target Date 12/13/20      PT LONG TERM GOAL #5   Title Patient to report ability to perform ADLs, household, and sport-related activities without limitation due to R shoulder pain, LOM or weakness    Status On-going   has not returned to working out but notes improvement with R UE use with getting out of bed   Target Date 12/13/20                   Plan - 12/02/20 1101     Clinical Impression Statement Daniel Ramos continues to note main issue remains with lifting with arm forward or out the side with palm down such as when picking up something with a handle on top. Continued tendency noted for UT shrug on R when lifting in this pattern - pt able to partially correct with verbal and tactile cues with better movement tolerance but still with painful midrange arc. R shoulder joint mobs performed to promote decreased capsular restriction resulting in further improved abduction tolerance although still with painful midrange arc. Continued periscapular and shoulder strengthening targeting humeral head depression with overhead ROM to reduce risk for impingement.    Rehab Potential Good    PT Frequency 2x / week    PT Duration 6 weeks    PT Treatment/Interventions ADLs/Self Care Home Management;Cryotherapy;Moist Heat;Ultrasound;Therapeutic activities;Therapeutic exercise;Neuromuscular re-education;Patient/family education;Manual techniques;Passive range of motion;Dry needling;Taping;Joint Manipulations    PT Next Visit Plan Practice RTC strengthening in 90/90 position to help with throwing motions; lifting simulation address proper mechanics; periscap strengthening; MT to address abnormal muscle tension or promote normal GH kinematics as indicated    PT Home Exercise Plan Access Codes: [email or web address] (Nov 23, 2022), [email or web address] (6/21)    Consulted and Agree with Plan of Care Patient             Patient will benefit from skilled therapeutic  intervention in order to improve the following deficits and impairments:  Decreased activity tolerance, Decreased knowledge of precautions, Decreased range of motion, Decreased strength, Increased fascial restricitons, Increased muscle spasms, Impaired perceived functional ability, Impaired flexibility, Impaired UE functional use, Improper body mechanics,  Postural dysfunction, Pain  Visit Diagnosis: Chronic right shoulder pain  Abnormal posture  Stiffness of right shoulder, not elsewhere classified  Muscle weakness (generalized)     Problem List There are no problems to display for this patient.   Percival Spanish, PT, MPT 12/02/2020, 12:08 PM  [facility] [address]  [place], [place], [address] Phone: [phone]   Fax:  [phone]  Name: Daniel Ramos MRN: [id number] Date of Birth: January 05, 1986

## 2020-12-07 ENCOUNTER — Other Ambulatory Visit: Payer: Self-pay

## 2020-12-07 ENCOUNTER — Ambulatory Visit: Payer: Medicaid Other

## 2020-12-07 DIAGNOSIS — R293 Abnormal posture: Secondary | ICD-10-CM

## 2020-12-07 DIAGNOSIS — M6281 Muscle weakness (generalized): Secondary | ICD-10-CM

## 2020-12-07 DIAGNOSIS — M25611 Stiffness of right shoulder, not elsewhere classified: Secondary | ICD-10-CM

## 2020-12-07 DIAGNOSIS — G8929 Other chronic pain: Secondary | ICD-10-CM

## 2020-12-07 DIAGNOSIS — M25511 Pain in right shoulder: Secondary | ICD-10-CM | POA: Diagnosis not present

## 2020-12-07 NOTE — Therapy (Signed)
[facility] [address]  [place], [place], [address] Phone: [phone]   Fax:  [phone]  Physical Therapy Treatment/ Progress Note  Patient Details  Name: Dayan Desa MRN: [id number] Date of Birth: [birth year] Referring Provider (PT): Gregor Hams, MD   Encounter Date: 12/07/2020   PT End of Session - 12/07/20 1013     Visit Number 10    Number of Visits 13    Date for PT Re-Evaluation 12/17/20    Authorization Type [facility] Medicaid    Authorization Time Period 11/02/20 - 12/17/20    Authorization - Visit Number 9    Authorization - Number of Visits 12    PT Start Time 0935   pt late   PT Stop Time 1013    PT Time Calculation (min) 38 min    Activity Tolerance Patient tolerated treatment well    Behavior During Therapy [facility] for tasks assessed/performed             Past Medical History:  Diagnosis Date   History of chicken pox     Past Surgical History:  Procedure Laterality Date   NO PAST SURGERIES      There were no vitals filed for this visit.   Subjective Assessment - 12/07/20 0941     Subjective Pt is doing well without any new complaints.    Diagnostic tests R shoulder x-ray 10/12/20 - Mild degenerative change without acute bony abnormality.  R shoulder [place] 10/12/20 - Subacromial bursitis and mild AC DJD    Patient Stated Goals "my shoulder to be 100% w/o having to have surgery"    Currently in Pain? No/denies                [facility] PT Assessment - 12/07/20 0001       Strength   Right Shoulder Flexion 5/5    Right Shoulder ABduction 4+/5   pain   Right Shoulder Internal Rotation 5/5    Right Shoulder External Rotation 5/5                           [facility] Adult PT Treatment/Exercise - 12/07/20 0001       Shoulder Exercises: Standing   Horizontal ABduction Strengthening;Both;10 reps;Theraband    Theraband Level (Shoulder Horizontal ABduction) Level 3 (Green)     Horizontal ABduction Limitations bent over    External Rotation Strengthening;Both;10 reps    External Rotation Limitations TRX assist    Other Standing Exercises ball tosses with small ball and baseball swings with green TB 15x each    Other Standing Exercises B ER with green TB 10 reps      Shoulder Exercises: ROM/Strengthening   UBE (Upper Arm Bike) L3.0 x 6 min (3' fwd/3' back)    Wall Pushups 20 reps    Wall Pushups Limitations with red TB (serratus slide)    Ball on Wall 3 way- flexion, scaption, and crossbody with 2# cuff weight 10 reps each                      PT Short Term Goals - 11/12/20 1010       PT SHORT TERM GOAL #1   Title Patient will be independent with initial HEP    Status Achieved   11/12/2020              PT Long Term Goals - 12/07/20 6812  PT LONG TERM GOAL #1   Title Patient will be independent with ongoing/advanced HEP for self-management at home    Status Partially Met      PT LONG TERM GOAL #2   Title Improve posture and alignment with patient to demonstrate improved upright posture with posterior shoulder girdle engaged    Status Partially Met   shoulders rounded but he is able to self correct     PT LONG TERM GOAL #3   Title Patient to improve R shoulder AROM to WNL without pain provocation    Status Achieved   11/23/20     PT LONG TERM GOAL #4   Title Patient will demonstrate improved R shoulder strength to 5/5 w/o pain for functional UE use    Status Partially Met   met in all planes except abduction     PT LONG TERM GOAL #5   Title Patient to report ability to perform ADLs, household, and sport-related activities without limitation due to R shoulder pain, LOM or weakness    Status Partially Met   still has pain when having to lift his R arm into ABD                  Plan - 12/07/20 1014     Clinical Impression Statement Pt has met his goal for R shoulder strength in all planes except abduction. He has  been more aware of posture and able to self correct but still has rounded shoulders at rest. He notes improvements with PT but plans to schedule an appointment with his MD to get an MRI. He demonstrates good performance of exercises but does struggle with keep proper position at 90/90 deg with the R shoulder exercises. We practiced throwing and baseball swings today and did report discomfort with initiating a throw in abduction and ER. He is still progressing toward goals despite having continued difficulty with the abduction.    Personal Factors and Comorbidities Time since onset of injury/illness/exacerbation;Past/Current Experience    PT Frequency 2x / week    PT Duration 6 weeks    PT Treatment/Interventions ADLs/Self Care Home Management;Cryotherapy;Moist Heat;Ultrasound;Therapeutic activities;Therapeutic exercise;Neuromuscular re-education;Patient/family education;Manual techniques;Passive range of motion;Dry needling;Taping;Joint Manipulations    PT Next Visit Plan Practice RTC strengthening in 90/90 position to help with throwing motions; lifting simulation address proper mechanics; periscap strengthening; MT to address abnormal muscle tension or promote normal GH kinematics as indicated    PT Home Exercise Plan Access Codes: [email or web address] (11/05/22), [email or web address] (6/21)    Consulted and Agree with Plan of Care Patient             Patient will benefit from skilled therapeutic intervention in order to improve the following deficits and impairments:  Decreased activity tolerance, Decreased knowledge of precautions, Decreased range of motion, Decreased strength, Increased fascial restricitons, Increased muscle spasms, Impaired perceived functional ability, Impaired flexibility, Impaired UE functional use, Improper body mechanics, Postural dysfunction, Pain  Visit Diagnosis: Chronic right shoulder pain  Abnormal posture  Stiffness of right shoulder, not elsewhere classified  Muscle weakness  (generalized)     Problem List There are no problems to display for this patient.   Artist Pais, PTA 12/07/2020, 11:54 AM  [facility] [address]  [place], [place], [address] Phone: [phone]   Fax:  [phone]  Name: Davell Beckstead MRN: [id number] Date of Birth: 03-12-1986

## 2020-12-09 ENCOUNTER — Ambulatory Visit: Payer: Medicaid Other | Admitting: Physical Therapy

## 2020-12-09 ENCOUNTER — Other Ambulatory Visit: Payer: Self-pay

## 2020-12-09 ENCOUNTER — Encounter: Payer: Self-pay | Admitting: Physical Therapy

## 2020-12-09 DIAGNOSIS — M25611 Stiffness of right shoulder, not elsewhere classified: Secondary | ICD-10-CM

## 2020-12-09 DIAGNOSIS — R293 Abnormal posture: Secondary | ICD-10-CM

## 2020-12-09 DIAGNOSIS — M25511 Pain in right shoulder: Secondary | ICD-10-CM | POA: Diagnosis not present

## 2020-12-09 DIAGNOSIS — G8929 Other chronic pain: Secondary | ICD-10-CM

## 2020-12-09 DIAGNOSIS — M6281 Muscle weakness (generalized): Secondary | ICD-10-CM

## 2020-12-09 NOTE — Therapy (Signed)
[facility] [address]  [place], [place], [address] Phone: [phone]   Fax:  [phone]  Physical Therapy Treatment / Progress Note  Patient Details  Name: Daniel Ramos MRN: [id number] Date of Birth: Aug 22, 1985 Referring Provider (PT): Gregor Hams, MD  Progress Note  Reporting Period 11/01/2020 to 12/09/2020  See note below for Objective Data and Assessment of Progress/Goals.     Encounter Date: 12/09/2020   PT End of Session - 12/09/20 0933     Visit Number 11    Number of Visits 13    Date for PT Re-Evaluation 12/17/20    Authorization Type [facility] Medicaid    Authorization Time Period 11/02/20 - 12/17/20    Authorization - Visit Number 10    Authorization - Number of Visits 12   +2 re-evals   PT Start Time 0933    PT Stop Time 1014    PT Time Calculation (min) 41 min    Activity Tolerance Patient tolerated treatment well    Behavior During Therapy WFL for tasks assessed/performed             Past Medical History:  Diagnosis Date   History of chicken pox     Past Surgical History:  Procedure Laterality Date   NO PAST SURGERIES      There were no vitals filed for this visit.   Subjective Assessment - 12/09/20 0937     Subjective "Nothing new".    Diagnostic tests R shoulder x-ray 10/12/20 - Mild degenerative change without acute bony abnormality.  R shoulder [place] 10/12/20 - Subacromial bursitis and mild AC DJD    Patient Stated Goals "my shoulder to be 100% w/o having to have surgery"    Currently in Pain? No/denies    Pain Score 0-No pain   pain still up to 7-8/10 when lifting away from body especially with pam down   Pain Location Shoulder    Pain Orientation Right    Pain Frequency Occasional                [facility] PT Assessment - 12/09/20 0933       Assessment   Medical Diagnosis Chronic R shoulder pain    Referring Provider (PT) Gregor Hams, MD    Onset Date/Surgical  Date --   summer 2021   Next MD Visit 12/10/20      AROM   Overall AROM Comments B shoulder AROM WNL      Strength   Right Shoulder Flexion 5/5    Right Shoulder ABduction 4+/5   pain   Right Shoulder Internal Rotation 5/5    Right Shoulder External Rotation 5/5                           [facility] Adult PT Treatment/Exercise - 12/09/20 0933       Exercises   Exercises Shoulder      Shoulder Exercises: Standing   External Rotation Right;15 reps;Strengthening;Theraband   2 sets   Theraband Level (Shoulder External Rotation) Level 3 (Green)    External Rotation Limitations 90/90 position    Internal Rotation Right;15 reps;Strengthening;Theraband   2 sets   Theraband Level (Shoulder Internal Rotation) Level 3 (Green)    Internal Rotation Limitations 90/90 position    Diagonals Right;10 reps;15 reps;Strengthening;Theraband    Theraband Level (Shoulder Diagonals) Level 3 (Green)    Diagonals Limitations D1/D2 flexion & extension  Other Standing Exercises R UE lifitng in abduction x 10 holding GameReady to simulate lifting opjects such as a 5 gallon gas can from the bed of his truck      Shoulder Exercises: ROM/Strengthening   UBE (Upper Arm Bike) L4.0 x 6 min (3' fwd/3' back)    "W" Arms Green TB "W" rows 2 x 10    Other ROM/Strengthening Exercises Green TB lat pull-down 2 x 10      Manual Therapy   Manual Therapy Joint mobilization    Joint Mobilization R shoulder grade III-IV distraction, inferior & A/P glides - better abuction tolerance following jt mobs                      PT Short Term Goals - 11/12/20 1010       PT SHORT TERM GOAL #1   Title Patient will be independent with initial HEP    Status Achieved   11/12/2020              PT Long Term Goals - 12/07/20 0943       PT LONG TERM GOAL #1   Title Patient will be independent with ongoing/advanced HEP for self-management at home    Status Partially Met      PT LONG TERM GOAL #2    Title Improve posture and alignment with patient to demonstrate improved upright posture with posterior shoulder girdle engaged    Status Partially Met   shoulders rounded but he is able to self correct     PT LONG TERM GOAL #3   Title Patient to improve R shoulder AROM to WNL without pain provocation    Status Achieved   11/23/20     PT LONG TERM GOAL #4   Title Patient will demonstrate improved R shoulder strength to 5/5 w/o pain for functional UE use    Status Partially Met   met in all planes except abduction     PT LONG TERM GOAL #5   Title Patient to report ability to perform ADLs, household, and sport-related activities without limitation due to R shoulder pain, LOM or weakness    Status Partially Met   still has pain when having to lift his R arm into ABD                  Plan - 12/09/20 0940     Clinical Impression Statement Daniel Ramos reports he cut back on taking anti-inflammatory meds over the weekend and notes that the pain definitely increased with certain motions, especially lifting in abduction and reaching behind while arm out to the side. As such, he reached out to MD and will f/u with MD tomorrow - he hopes to proceed with the MRI as well as a possible bursa injection. Currently his R shoulder ROM is WNL and essentially symmetrical to the L shoulder, and his strength is 5/5 for all motions except abduction at 4+/5 with pain upon MMT resistance. Pain remains intermittent with movements as previously described and typically no pain at rest other than unable to sleep in his preferred position on his side with his arm under the pillow. Patient does note some improved motion tolerance following joint mobilizations but otherwise no significant relief from anything other than anti-inflammatories. Will await plan from MD f/u tomorrow to determine further PT course.    Rehab Potential Good    PT Frequency 2x / week    PT Duration 6 weeks    PT  Treatment/Interventions ADLs/Self  Care Home Management;Cryotherapy;Moist Heat;Ultrasound;Therapeutic activities;Therapeutic exercise;Neuromuscular re-education;Patient/family education;Manual techniques;Passive range of motion;Dry needling;Taping;Joint Manipulations    PT Next Visit Plan Practice RTC strengthening in 90/90 position to help with throwing motions; lifting simulation address proper mechanics; periscap strengthening; MT to address abnormal muscle tension or promote normal GH kinematics as indicated    PT Home Exercise Plan Access Codes: [email or web address] (12-02-2022), [email or web address] (6/21)    Consulted and Agree with Plan of Care Patient             Patient will benefit from skilled therapeutic intervention in order to improve the following deficits and impairments:  Decreased activity tolerance, Decreased knowledge of precautions, Decreased range of motion, Decreased strength, Increased fascial restricitons, Increased muscle spasms, Impaired perceived functional ability, Impaired flexibility, Impaired UE functional use, Improper body mechanics, Postural dysfunction, Pain  Visit Diagnosis: Chronic right shoulder pain  Abnormal posture  Stiffness of right shoulder, not elsewhere classified  Muscle weakness (generalized)     Problem List There are no problems to display for this patient.   Percival Spanish, PT, MPT 12/09/2020, 1:09 PM  [facility] [address]  [place], [place], [address] Phone: [phone]   Fax:  [phone]  Name: Daniel Ramos MRN: [id number] Date of Birth: 24-Dec-1985

## 2020-12-10 ENCOUNTER — Other Ambulatory Visit: Payer: Self-pay

## 2020-12-10 ENCOUNTER — Ambulatory Visit: Payer: Medicaid Other | Admitting: Family Medicine

## 2020-12-10 VITALS — BP 150/98 | HR 89 | Ht 69.0 in | Wt 233.0 lb

## 2020-12-10 DIAGNOSIS — G8929 Other chronic pain: Secondary | ICD-10-CM

## 2020-12-10 DIAGNOSIS — M25511 Pain in right shoulder: Secondary | ICD-10-CM

## 2020-12-10 NOTE — Progress Notes (Signed)
   I, [name], LAT, ATC acting as a scribe for Daniel Graham, MD.  Daniel Ramos is a 35 y.o. male who presents to [facility] today for f/u chronic R shoulder pain thought to be due to [facility] tendinopathy, subacromial bursitis, and AC DJD. Pt was last seen by Dr. Denyse Amass on 10/12/20 and was referred to PT of which he's completed 11 visits. Pt was given a R shoulder steroid injection by PCP on 10/12/20. Today, pt reports R shoulder is still painful, w/ slight improvement from PT. Pt locates pain to the anterior aspect of the R shoulder. No numbness/tingling noted. No pain at rest, only w/ specific motions.  Dx imaging: 10/12/20 R shoulder XR  Pertinent review of systems: No fevers or chills  Relevant historical information: Otherwise healthy   Exam:  BP (!) 150/98 (BP Location: Left Arm, Patient Position: Sitting, Cuff Size: Normal)   Pulse 89   Ht 5\' 9"  (1.753 m)   Wt 233 lb (105.7 kg)   SpO2 99%   BMI 34.41 kg/m  General: Well Developed, well nourished, and in no acute distress.   MSK: Right shoulder normal-appearing Nontender. Some pain with abduction. Range of motion intact. Positive impingement testing. Intact strength.  Pain with resisted abduction.    Lab and Radiology Results  EXAM: RIGHT SHOULDER - 2+ VIEW   COMPARISON:  None.   FINDINGS: Minimal degenerative changes of the acromioclavicular joint are noted. No acute fracture or dislocation is seen. No soft tissue abnormality is noted.   IMPRESSION: Mild degenerative change without acute bony abnormality.     Electronically Signed   By: M.D.   On: 10/13/2020 23:47 I, 10/15/2020, personally (independently) visualized and performed the interpretation of the images attached in this note.      Assessment and Plan: 35 y.o. male with right shoulder pain thought to be due to subacromial bursitis and possible rotator cuff tendinopathy.  Patient has had 11 sessions of  physical therapy over the past 2 months with mild improvement.  Additionally he had an unguided cortisone injection performed by PCP as well.  Plan to proceed to MRI for potential other injection planning or surgical planning and to further characterize cause of pain.  Recheck after MRI.   PDMP not reviewed this encounter. Orders Placed This Encounter  Procedures   MR SHOULDER RIGHT WO CONTRAST    Standing Status:   Future    Standing Expiration Date:   12/10/2021    Order Specific Question:   What is the patient's sedation requirement?    Answer:   No Sedation    Order Specific Question:   Does the patient have a pacemaker or implanted devices?    Answer:   No    Order Specific Question:   Preferred imaging location?    Answer:   12/12/2021 (table limit-350lbs)   No orders of the defined types were placed in this encounter.    Discussed warning signs or symptoms. Please see discharge instructions. Patient expresses understanding.   The above documentation has been reviewed and is accurate and complete Licensed conveyancer, M.D.

## 2020-12-10 NOTE — Patient Instructions (Signed)
Thank you for coming in today.   You should hear from MRI scheduling within 1 week. If you do not hear please let me know.    Plan to recheck after the MRI is done where we can review the MRI and likely do an injection right then.   [phone] is the phone number to the MRI there in [place].

## 2020-12-14 ENCOUNTER — Encounter: Payer: Self-pay | Admitting: Physical Therapy

## 2020-12-14 ENCOUNTER — Ambulatory Visit: Payer: Medicaid Other | Admitting: Physical Therapy

## 2020-12-14 ENCOUNTER — Other Ambulatory Visit: Payer: Self-pay

## 2020-12-14 DIAGNOSIS — G8929 Other chronic pain: Secondary | ICD-10-CM

## 2020-12-14 DIAGNOSIS — M25611 Stiffness of right shoulder, not elsewhere classified: Secondary | ICD-10-CM

## 2020-12-14 DIAGNOSIS — M6281 Muscle weakness (generalized): Secondary | ICD-10-CM

## 2020-12-14 DIAGNOSIS — M25511 Pain in right shoulder: Secondary | ICD-10-CM | POA: Diagnosis not present

## 2020-12-14 DIAGNOSIS — R293 Abnormal posture: Secondary | ICD-10-CM

## 2020-12-14 NOTE — Therapy (Addendum)
PHYSICAL THERAPY DISCHARGE SUMMARY (02/25/21)  Visits from Start of Care: 12  Current functional level related to goals / functional outcomes: See treatment note below.  Returned to MD and awaiting MRI results to determine if further PT recommended.   Remaining deficits: See treatment note below   Education / Equipment: HEP  Plan: Patient is being discharged due to not returning within 30 day hold period.  He was last seen on 12/14/20 and will require new referral to return to PT.    Daniel Ramos, PT, DPT  [facility] [address]  [place], [place], [address] Phone: [phone]   Fax:  [phone]  Physical Therapy Treatment  Patient Details  Name: Daniel Ramos MRN: [id number] Date of Birth: 1986-02-09 Referring Provider (PT): Daniel Hams, MD   Encounter Date: 12/14/2020   PT End of Session - 12/14/20 1019     Visit Number 12    Number of Visits 13    Date for PT Re-Evaluation 12/17/20    Authorization Type [facility] Medicaid    Authorization Time Period 11/02/20 - 12/17/20    Authorization - Visit Number 11    Authorization - Number of Visits 12   +2 re-evals   PT Start Time 1019    PT Stop Time 1055    PT Time Calculation (min) 36 min    Activity Tolerance Patient tolerated treatment well    Behavior During Therapy WFL for tasks assessed/performed             Past Medical History:  Diagnosis Date   History of chicken pox     Past Surgical History:  Procedure Laterality Date   NO PAST SURGERIES      There were no vitals filed for this visit.   Subjective Assessment - 12/14/20 1022     Subjective Pt reports MD has ordered an MRI to determine where to localize the injection and to identifiy if there is any need for a surgical consult. He is to complete today's PT session and then hold PT until MRI results back and MD determines further course of action.    Diagnostic tests R  shoulder x-ray 10/12/20 - Mild degenerative change without acute bony abnormality.  R shoulder [place] 10/12/20 - Subacromial bursitis and mild AC DJD    Patient Stated Goals "my shoulder to be 100% w/o having to have surgery"    Currently in Pain? No/denies                [facility] PT Assessment - 12/14/20 1019       Assessment   Medical Diagnosis Chronic R shoulder pain    Referring Provider (PT) Daniel Hams, MD    Onset Date/Surgical Date --   summer 2021   Next MD Visit pending shoulder MRI      AROM   Overall AROM Comments B shoulder AROM WNL      Strength   Right Shoulder Flexion 5/5    Right Shoulder ABduction --   5-/5   Right Shoulder Internal Rotation --   5-/5 - some discomfort with resisted motion at 90/90   Right Shoulder External Rotation 4+/5   increased effort and slight pain/discomfort noted   Left Shoulder Flexion 5/5    Left Shoulder ABduction 5/5    Left Shoulder Internal Rotation 5/5    Left Shoulder External Rotation 5/5  [facility] Adult PT Treatment/Exercise - 12/14/20 1019       Exercises   Exercises Shoulder      Shoulder Exercises: Standing   Diagonals Right;10 reps;15 reps;Strengthening;Theraband    Theraband Level (Shoulder Diagonals) Level 3 (Green)    Diagonals Limitations D1/D2 flexion & extension      Shoulder Exercises: ROM/Strengthening   UBE (Upper Arm Bike) L4.0 x 6 min (3' fwd/3' back)                      PT Short Term Goals - 11/12/20 1010       PT SHORT TERM GOAL #1   Title Patient will be independent with initial HEP    Status Achieved   11/12/2020              PT Long Term Goals - 12/14/20 1033       PT LONG TERM GOAL #1   Title Patient will be independent with ongoing/advanced HEP for self-management at home    Status Achieved   12/14/20     PT LONG TERM GOAL #2   Title Improve posture and alignment with patient to demonstrate improved upright posture with  posterior shoulder girdle engaged    Status Achieved   12/14/20     PT LONG TERM GOAL #3   Title Patient to improve R shoulder AROM to WNL without pain provocation    Status Achieved   11/23/20     PT LONG TERM GOAL #4   Title Patient will demonstrate improved R shoulder strength to 5/5 w/o pain for functional UE use    Status Partially Met      PT LONG TERM GOAL #5   Title Patient to report ability to perform ADLs, household, and sport-related activities without limitation due to R shoulder pain, LOM or weakness    Status Partially Met   12/14/20 - still has pain when having to lift his R arm into ABD or with sustained resistance or repetitive motions in ER/IR at [place] - 12/14/20 1038     Clinical Impression Statement Daniel Ramos has made good progress with PT with restoration of full R shoulder ROM and improving strength, continues to experience occasional R shoulder pain with certain motions, lifting away from body or resisted shoulder motion in abduction or 90/90 IR/ER. Majority of goals met with exceptions of strength and functional activity tolerance goals only partially met. He reports MD wants him to take a break from PT pending an MRI of his R shoulder, therefore today's session focused on review and clarification of ongoing HEP, clarifying appropriate frequency to allow to adequate rest and healing. Daniel Ramos will remain on 30-day hold in case MD decides to have him return to PT following MRI.    PT Treatment/Interventions ADLs/Self Care Home Management;Cryotherapy;Moist Heat;Ultrasound;Therapeutic activities;Therapeutic exercise;Neuromuscular re-education;Patient/family education;Manual techniques;Passive range of motion;Dry needling;Taping;Joint Manipulations    PT Next Visit Plan 30-day hold pending R shoulder MRI & MD reasessment    PT Home Exercise Plan Access Codes: [email or web address] (November 20, 2022), [email or web address] (6/21)    Consulted and Agree with Plan of Care Patient              Patient will benefit from skilled therapeutic intervention in order to improve the following deficits and impairments:  Decreased activity tolerance, Decreased knowledge of precautions, Decreased range of motion, Decreased strength, Increased fascial restricitons, Increased muscle spasms,  Impaired perceived functional ability, Impaired flexibility, Impaired UE functional use, Improper body mechanics, Postural dysfunction, Pain  Visit Diagnosis: Chronic right shoulder pain  Abnormal posture  Stiffness of right shoulder, not elsewhere classified  Muscle weakness (generalized)     Problem List There are no problems to display for this patient.   Percival Spanish, PT, MPT 12/14/2020, 10:59 AM  [facility] [address]  [place], [place], [address] Phone: [phone]   Fax:  [phone]  Name: Daniel Ramos MRN: [id number] Date of Birth: [birth year]

## 2021-01-02 ENCOUNTER — Other Ambulatory Visit: Payer: Self-pay

## 2021-01-02 ENCOUNTER — Ambulatory Visit (INDEPENDENT_AMBULATORY_CARE_PROVIDER_SITE_OTHER): Payer: Medicaid Other

## 2021-01-02 DIAGNOSIS — G8929 Other chronic pain: Secondary | ICD-10-CM

## 2021-01-02 DIAGNOSIS — M25511 Pain in right shoulder: Secondary | ICD-10-CM

## 2021-01-04 NOTE — Progress Notes (Signed)
Right shoulder MRI shows some rotator cuff tendinitis and some tiny rotator cuff tears.  Could be treated with injection and if that fails then surgery.  Recommend return to clinic to go over the MRI results of full detail and very likely proceed with trial of ultrasound-guided injection

## 2021-01-19 NOTE — Progress Notes (Signed)
I, Daniel Ramos, LAT, ATC, am serving as scribe for Dr. Clementeen Ramos.  Daniel Ramos is a 35 y.o. male who presents to [facility] at [facility] today for f/u of chronic R shoulder pain.  He was last seen by Dr. Denyse Ramos on 12/10/20 after having completed 11 PT sessions w/ con't pain and was referred for a R shoulder MRI that he had on 01/02/21.  He had a non-guided R shoulder steroid injection performed by his PCP on 10/12/20.  Today, pt reports some improvement in his shoulder pain and only rates his pain level at a 4-5/10.   Diagnostic imaging: R shoulder MRI- 01/02/21;   R shoulder XR- 10/12/20   Pertinent review of systems: No fevers or chills  Relevant historical information: Otherwise healthy   Exam:  BP 138/90   Pulse 90   Ht 5\' 9"  (1.753 m)   Wt 238 lb 9.6 oz (108.2 kg)   SpO2 98%   BMI 35.24 kg/m  General: Well Developed, well nourished, and in no acute distress.   MSK: Right shoulder normal-appearing normal motion some pain with abduction.  Intact strength.    Lab and Radiology Results  Procedure: Real-time Ultrasound Guided Injection of right shoulder subacromial bursa Device: Philips Affiniti 50G Images permanently stored and available for review in PACS Verbal informed consent obtained.  Discussed risks and benefits of procedure. Warned about infection bleeding damage to structures skin hypopigmentation and fat atrophy among others. Patient expresses understanding and agreement Time-out conducted.   Noted no overlying erythema, induration, or other signs of local infection.   Skin prepped in a sterile fashion.   Local anesthesia: Topical Ethyl chloride.   With sterile technique and under real time ultrasound guidance: 40 mg of Kenalog and 2 mL of Marcaine injected into subacromial bursa. Fluid seen entering the bursa.   Completed without difficulty   Pain moderately  resolved suggesting accurate placement of the medication.   Advised to call if  fevers/chills, erythema, induration, drainage, or persistent bleeding.   Images permanently stored and available for review in the ultrasound unit.  Impression: Technically successful ultrasound guided injection.     EXAM: MRI OF THE RIGHT SHOULDER WITHOUT CONTRAST   TECHNIQUE: Multiplanar, multisequence MR imaging of the shoulder was performed. No intravenous contrast was administered.   COMPARISON:  Right shoulder x-rays dated Daniel 17, Ramos.   FINDINGS: Rotator cuff: Mild supraspinatus and infraspinatus tendinosis with intrasubstance tear involving both insertions, measuring 3.3 cm in AP dimension. The subscapularis and teres minor tendons are unremarkable.   Muscles: No atrophy or abnormal signal of the muscles of the rotator cuff.   Biceps long head:  Intact and normally positioned.   Acromioclavicular Joint: Mild arthropathy of the acromioclavicular joint. Type II acromion. No subacromial/subdeltoid bursal fluid.   Glenohumeral Joint: No joint effusion. No chondral defect.   Labrum: Grossly intact, but evaluation is limited by lack of intraarticular fluid.   Bones:  No marrow abnormality, fracture or dislocation.   Other: None.   IMPRESSION: 1. Mild supraspinatus and infraspinatus tendinosis with intrasubstance tear involving both insertions. 2. Mild acromioclavicular osteoarthritis.     Electronically Signed   By: Daniel Ramos M.D.   On: 08/08/Ramos 15:18  I, 10/08/Ramos, personally (independently) visualized and performed the interpretation of the images attached in this note. '     Assessment and Plan: 35 y.o. male with right shoulder pain.  Patient had moderate improvement in shoulder pain with good course of physical  therapy.  Follow-up MRI shows mild tendinosis of the supraspinatus and infraspinatus tendon with small interstitial tear at the insertion site of both the supraspinatus and infraspinatus tendon.  He is doing pretty well overall but still  having some pain.  Plan for subacromial bursa injection today.  Continue home exercise program.  If not better he will let me know and I will likely refer to orthopedic surgery for surgical consultation.  He Daniel benefit from subacromial decompression.   PDMP not reviewed this encounter. Orders Placed This Encounter  Procedures   [place] LIMITED JOINT SPACE STRUCTURES UP RIGHT(NO LINKED CHARGES)    Standing Status:   Future    Number of Occurrences:   1    Standing Expiration Date:   07/23/2021    Order Specific Question:   Reason for Exam (SYMPTOM  OR DIAGNOSIS REQUIRED)    Answer:   right shoulder pain    Order Specific Question:   Preferred imaging location?    Answer:   [facility]   No orders of the defined types were placed in this encounter.    Discussed warning signs or symptoms. Please see discharge instructions. Patient expresses understanding.   The above documentation has been reviewed and is accurate and complete Daniel Ramos, M.D.

## 2021-01-20 ENCOUNTER — Ambulatory Visit: Payer: Medicaid Other | Admitting: Family Medicine

## 2021-01-20 ENCOUNTER — Ambulatory Visit: Payer: Self-pay

## 2021-01-20 ENCOUNTER — Other Ambulatory Visit: Payer: Self-pay

## 2021-01-20 VITALS — BP 138/90 | HR 90 | Ht 69.0 in | Wt 238.6 lb

## 2021-01-20 DIAGNOSIS — G8929 Other chronic pain: Secondary | ICD-10-CM | POA: Diagnosis not present

## 2021-01-20 DIAGNOSIS — M25511 Pain in right shoulder: Secondary | ICD-10-CM

## 2021-01-20 NOTE — Patient Instructions (Signed)
Thank you for coming in today.   Call or go to the ER if you develop a large red swollen joint with extreme pain or oozing puss.    Continue the home exercises.   If not much better next step is surgical consultation.  Let me know.

## 2021-02-27 ENCOUNTER — Encounter: Payer: Self-pay | Admitting: Family Medicine

## 2021-02-28 ENCOUNTER — Encounter: Payer: Medicaid Other | Admitting: Family Medicine

## 2021-03-25 ENCOUNTER — Encounter: Payer: Self-pay | Admitting: Family Medicine

## 2021-03-30 NOTE — Progress Notes (Signed)
I, Daniel Ramos, LAT, ATC acting as a scribe for Daniel Graham, MD.  Daniel Ramos is a 35 y.o. male who presents to [facility] at [facility] today for L shoulder pain. Pt was previously seen by Dr. Denyse Amass on 01/20/21 for his R shoulder and was given a R subacromial steroid injection. Today, pt c/o L shoulder pain x 2 month w/ no MOI. Pt locates pain to anterior aspect of the L shoulder and deep within the joint.  Neck pain: no Radiates: no Mechanical symptoms: yes UE numbness/tingling: yes- depending on movt UE weakness: no Aggravates: ABD, protraction Treatments tried: Some home exercise program previously taught by PT for the right shoulder.   Pertinent review of systems: No fevers or chills  Relevant historical information: Prior contralateral right shoulder rotator cuff tendinitis with tiny rotator cuff tear treated with PT, home exercise program, and injection.   Exam:  BP 134/88   Pulse 96   Ht 5\' 9"  (1.753 m)   Wt 233 lb 9.6 oz (106 kg)   SpO2 98%   BMI 34.50 kg/m  General: Well Developed, well nourished, and in no acute distress.   MSK: Left shoulder: Normal-appearing Nontender. Normal motion to abduction internal and external rotation. Strength is intact abduction and external and internal rotation and forward flexion. Mildly positive Hawkins and Neer's test. Mildly positive empty can test. Negative Yergason and speeds test. Mildly positive O'Brien test. Negative crossover arm compression test. Pulses capillary refill and sensation are intact distally.    Lab and Radiology Results  Procedure: Real-time Ultrasound Guided Injection of left shoulder subacromial bursa Device: Philips Affiniti 50G Images permanently stored and available for review in PACS Ultrasound evaluation prior to injection reveals mild subacromial bursitis.  Intact rotator cuff tendons. Verbal informed consent obtained.  Discussed risks and benefits of procedure. Warned  about infection bleeding damage to structures skin hypopigmentation and fat atrophy among others. Patient expresses understanding and agreement Time-out conducted.   Noted no overlying erythema, induration, or other signs of local infection.   Skin prepped in a sterile fashion.   Local anesthesia: Topical Ethyl chloride.   With sterile technique and under real time ultrasound guidance: 40 mg of Kenalog and 2 mL of lidocaine injected into subacromial bursa. Fluid seen entering the bursa.   Completed without difficulty   Pain immediately resolved suggesting accurate placement of the medication.   Advised to call if fevers/chills, erythema, induration, drainage, or persistent bleeding.   Images permanently stored and available for review in the ultrasound unit.  Impression: Technically successful ultrasound guided injection.        Assessment and Plan: 35 y.o. male with left shoulder pain ongoing for several months now.  Pain similar to prior contralateral right shoulder pain ultimately due to rotator cuff tendinopathy and bursitis.  Plan to treat with subacromial injection as above, and continued home exercise program that he has previously been taught for his left shoulder.  Rib size home exercise program.  Recheck back as needed.  Certainly if needed in the future could proceed with formal physical therapy, x-ray, or even MRI.   PDMP not reviewed this encounter. Orders Placed This Encounter  Procedures   31 LIMITED JOINT SPACE STRUCTURES UP LEFT(NO LINKED CHARGES)    Standing Status:   Future    Number of Occurrences:   1    Standing Expiration Date:   09/27/2021    Order Specific Question:   Reason for Exam (SYMPTOM  OR DIAGNOSIS REQUIRED)  Answer:   left shoulder pain    Order Specific Question:   Preferred imaging location?    Answer:   [facility]   No orders of the defined types were placed in this encounter.    Discussed warning signs or symptoms.  Please see discharge instructions. Patient expresses understanding.   The above documentation has been reviewed and is accurate and complete Daniel Ramos, M.D.

## 2021-03-31 ENCOUNTER — Other Ambulatory Visit: Payer: Self-pay

## 2021-03-31 ENCOUNTER — Ambulatory Visit (INDEPENDENT_AMBULATORY_CARE_PROVIDER_SITE_OTHER): Payer: Medicaid Other | Admitting: Family Medicine

## 2021-03-31 ENCOUNTER — Ambulatory Visit: Payer: Self-pay

## 2021-03-31 VITALS — BP 134/88 | HR 96 | Ht 69.0 in | Wt 233.6 lb

## 2021-03-31 DIAGNOSIS — G8929 Other chronic pain: Secondary | ICD-10-CM

## 2021-03-31 DIAGNOSIS — M25512 Pain in left shoulder: Secondary | ICD-10-CM

## 2021-03-31 NOTE — Patient Instructions (Addendum)
Thank you for coming in today.   You received a steroid injection in your left shoulder today. Seek immediate medical attention if the joint becomes red, extremely painful, or is oozing fluid.   Work on home exercises that PT taught you.  Recheck back as needed.

## 2021-04-05 ENCOUNTER — Ambulatory Visit (INDEPENDENT_AMBULATORY_CARE_PROVIDER_SITE_OTHER): Payer: Medicaid Other | Admitting: Family Medicine

## 2021-04-05 ENCOUNTER — Encounter: Payer: Self-pay | Admitting: Family Medicine

## 2021-04-05 ENCOUNTER — Other Ambulatory Visit: Payer: Self-pay

## 2021-04-05 VITALS — BP 122/90 | HR 88 | Temp 98.5°F | Ht 69.0 in | Wt 224.6 lb

## 2021-04-05 DIAGNOSIS — R030 Elevated blood-pressure reading, without diagnosis of hypertension: Secondary | ICD-10-CM | POA: Insufficient documentation

## 2021-04-05 DIAGNOSIS — J208 Acute bronchitis due to other specified organisms: Secondary | ICD-10-CM

## 2021-04-05 DIAGNOSIS — Z0000 Encounter for general adult medical examination without abnormal findings: Secondary | ICD-10-CM

## 2021-04-05 DIAGNOSIS — M75111 Incomplete rotator cuff tear or rupture of right shoulder, not specified as traumatic: Secondary | ICD-10-CM | POA: Diagnosis not present

## 2021-04-05 DIAGNOSIS — B9689 Other specified bacterial agents as the cause of diseases classified elsewhere: Secondary | ICD-10-CM

## 2021-04-05 DIAGNOSIS — N62 Hypertrophy of breast: Secondary | ICD-10-CM | POA: Diagnosis not present

## 2021-04-05 LAB — CBC WITH DIFFERENTIAL/PLATELET
Basophils Absolute: 0 10*3/uL (ref 0.0–0.1)
Basophils Relative: 0.5 % (ref 0.0–3.0)
Eosinophils Absolute: 0 10*3/uL (ref 0.0–0.7)
Eosinophils Relative: 0.3 % (ref 0.0–5.0)
HCT: 48.5 % (ref 39.0–52.0)
Hemoglobin: 15.9 g/dL (ref 13.0–17.0)
Lymphocytes Relative: 11.5 % — ABNORMAL LOW (ref 12.0–46.0)
Lymphs Abs: 1.1 10*3/uL (ref 0.7–4.0)
MCHC: 32.8 g/dL (ref 30.0–36.0)
MCV: 85.6 fl (ref 78.0–100.0)
Monocytes Absolute: 0.6 10*3/uL (ref 0.1–1.0)
Monocytes Relative: 6.6 % (ref 3.0–12.0)
Neutro Abs: 7.5 10*3/uL (ref 1.4–7.7)
Neutrophils Relative %: 81.1 % — ABNORMAL HIGH (ref 43.0–77.0)
Platelets: 232 10*3/uL (ref 150.0–400.0)
RBC: 5.66 Mil/uL (ref 4.22–5.81)
RDW: 13.9 % (ref 11.5–15.5)
WBC: 9.2 10*3/uL (ref 4.0–10.5)

## 2021-04-05 LAB — COMPREHENSIVE METABOLIC PANEL
ALT: 76 U/L — ABNORMAL HIGH (ref 0–53)
AST: 29 U/L (ref 0–37)
Albumin: 4.9 g/dL (ref 3.5–5.2)
Alkaline Phosphatase: 50 U/L (ref 39–117)
BUN: 13 mg/dL (ref 6–23)
CO2: 28 mEq/L (ref 19–32)
Calcium: 9.4 mg/dL (ref 8.4–10.5)
Chloride: 102 mEq/L (ref 96–112)
Creatinine, Ser: 1.11 mg/dL (ref 0.40–1.50)
GFR: 85.54 mL/min (ref >60.00)
Glucose, Bld: 95 mg/dL (ref 70–99)
Potassium: 4.1 mEq/L (ref 3.5–5.1)
Sodium: 140 mEq/L (ref 135–145)
Total Bilirubin: 0.7 mg/dL (ref 0.2–1.2)
Total Protein: 7.5 g/dL (ref 6.0–8.3)

## 2021-04-05 LAB — LIPID PANEL
Cholesterol: 136 mg/dL (ref 0–200)
HDL: 40 mg/dL (ref >39.00)
LDL Cholesterol: 80 mg/dL (ref 0–99)
NonHDL: 96.02
Total CHOL/HDL Ratio: 3
Triglycerides: 78 mg/dL (ref 0.0–149.0)
VLDL: 15.6 mg/dL (ref 0.0–40.0)

## 2021-04-05 LAB — TSH: TSH: 2.03 u[IU]/mL (ref 0.35–5.50)

## 2021-04-05 MED ORDER — AZITHROMYCIN 250 MG PO TABS: ORAL_TABLET | ORAL | 0 refills | Status: DC

## 2021-04-05 NOTE — Progress Notes (Signed)
Subjective  Chief Complaint  Patient presents with   Annual Exam    Fasting    HPI: Daniel Ramos is a 35 y.o. male who presents to [facility] today for a Male Wellness Visit. He also has the concerns and/or needs as listed above in the chief complaint. These will be addressed in addition to the Health Maintenance Visit.   Wellness Visit: annual visit with health maintenance review and exam   Health maintenance: 35 year old has been working hard with exercise and diet.  He is down about 20 pounds in the last 6 months.  Feeling stronger.  Weight training.  Would like to lose more quickly.  Energy level is good.  Immunizations are up-to-date.  Body mass index is 33.17 kg/m. Wt Readings from Last 3 Encounters:  04/05/21 224 lb 9.6 oz (101.9 kg)  03/31/21 233 lb 9.6 oz (106 kg)  01/20/21 238 lb 9.6 oz (108.2 kg)     Chronic disease management visit and/or acute problem visit: Pre-hypertension: Reviewed blood pressures.  No chest pain or shortness of breath Complains of 2-week history of productive thick greenish cough associated with sinus drainage.  No fevers or chills.  No shortness of breath.  Children were sick with the flu but he did not have typical flulike symptoms. Gynecomastia versus pseudogynecomastia: Reports he is always had enlarged breast tissue even when he was thinner as a teenager.  He would like his hormone levels checked.  No pain or masses noted Reviewed sports medicine notes for right shoulder pain: MRI revealed partial tears.  Fortunately he is done well with conservative treatment.  Patient Active Problem List   Diagnosis Date Noted   Gynecomastia, male 04/05/2021   Prehypertension 04/05/2021   Nontraumatic incomplete tear of right rotator cuff 04/05/2021    Health Maintenance  Topic Date Due   HIV Screening  Never done   Hepatitis C Screening  Never done   INFLUENZA VACCINE  12/27/2020   TETANUS/TDAP  05/28/2023    Pneumococcal Vaccine 55-39 Years old  Aged Out   HPV VACCINES  Aged Out   COVID-19 Vaccine  Discontinued   Immunization History  Administered Date(s) Administered   Influenza,inj,quad, With Preservative 03/29/2015   Influenza-Unspecified 03/01/2017   PPD Test 10/01/2014   We updated and reviewed the patient's past history in detail and it is documented below. Allergies: Patient is allergic to amoxicillin. Past Medical History  has a past medical history of History of chicken pox. Past Surgical History Patient  has a past surgical history that includes No past surgeries. Social History Patient  reports that he has never smoked. His smokeless tobacco use includes chew. He reports that he does not currently use alcohol. He reports that he does not use drugs. Family History family history includes Alcohol abuse in his father and paternal grandfather; Asthma in his son; Brain cancer in his paternal grandmother; COPD in his maternal grandmother; Healthy in his sister and son; Miscarriages / [place] in his mother; Pancreatic cancer in his maternal grandfather; Stroke in his paternal grandfather. Review of Systems: Constitutional: negative for fever or malaise Ophthalmic: negative for photophobia, double vision or loss of vision Cardiovascular: negative for chest pain, dyspnea on exertion, or new LE swelling Respiratory: negative for SOB or persistent cough Gastrointestinal: negative for abdominal pain, change in bowel habits or melena Genitourinary: negative for dysuria or gross hematuria Musculoskeletal: negative for new gait disturbance or muscular weakness Integumentary: negative for new or persistent rashes  Neurological: negative for TIA or stroke symptoms Psychiatric: negative for SI or delusions Allergic/Immunologic: negative for hives  Patient Care Team    Relationship Specialty Notifications Start End  Leamon Arnt, MD PCP - General Family Medicine  10/12/20    Objective   Vitals: BP 122/90   Pulse 88   Temp 98.5 F (36.9 C) (Temporal)   Ht 5\' 9"  (1.753 m)   Wt 224 lb 9.6 oz (101.9 kg)   SpO2 96%   BMI 33.17 kg/m  General:  Well developed, well nourished, no acute distress  Psych:  Alert and orientedx3,normal mood and affect HEENT:  Normocephalic, atraumatic, non-icteric sclera, PERRL, oropharynx is clear without mass or exudate, supple neck without adenopathy, mass or thyromegaly Cardiovascular:  Normal S1, S2, RRR without gallop, rub or murmur, nondisplaced PMI, +2 distal pulses in bilateral upper and lower extremities. Respiratory:  Good breath sounds bilaterally, CTAB with normal respiratory effort Gastrointestinal: normal bowel sounds, soft, non-tender, no noted masses. No HSM MSK: no deformities, contusions. Joints are without erythema or swelling. Spine and CVA region are nontender Skin:  Warm, no rashes or suspicious lesions noted Neurologic:    Mental status is normal. CN 2-11 are normal. Gross motor and sensory exams are normal. Stable gait. No tremor GU: No inguinal hernias or adenopathy are appreciated bilaterally Breast: Bilateral equal symmetric soft nontender subareolar tissue present.   Assessment  1. Annual physical exam   2. Prehypertension   3. Gynecomastia, male   4. Acute bacterial bronchitis   5. Nontraumatic incomplete tear of right rotator cuff      Plan  Male Wellness Visit: Age appropriate Health Maintenance and Prevention measures were discussed with patient. Included topics are cancer screening recommendations, ways to keep healthy (see AVS) including dietary and exercise recommendations, regular eye and dental care, use of seat belts, and avoidance of moderate alcohol use and tobacco use.  BMI: discussed patient's BMI and encouraged positive lifestyle modifications to help get to or maintain a target BMI. HM needs and immunizations were addressed and ordered. See below for orders. See HM and immunization section for  updates. Routine labs and screening tests ordered including cmp, cbc and lipids where appropriate. Discussed recommendations regarding Vit D and calcium supplementation (see AVS)  Chronic disease f/u and/or acute problem visit: (deemed necessary to be done in addition to the wellness visit): Gynecomastia: Check testosterone levels.  Reassured Monitor blood pressure.  Continue weight loss and exercise.  Should improve. Treat for bacterial bronchitis with Z-Pak.  Follow-up if unimproved Shoulder is doing better  Follow up: No follow-ups on file.  Commons side effects, risks, benefits, and alternatives for medications and treatment plan prescribed today were discussed, and the patient expressed understanding of the given instructions. Patient is instructed to call or message via MyChart if he/she has any questions or concerns regarding our treatment plan. No barriers to understanding were identified. We discussed Red Flag symptoms and signs in detail. Patient expressed understanding regarding what to do in case of urgent or emergency type symptoms.  Medication list was reconciled, printed and provided to the patient in AVS. Patient instructions and summary information was reviewed with the patient as documented in the AVS. This note was prepared with assistance of Dragon voice recognition software. Occasional wrong-word or sound-a-like substitutions may have occurred due to the inherent limitations of voice recognition software  This visit occurred during the SARS-CoV-2 public health emergency.  Safety protocols were in place, including screening questions prior to the  visit, additional usage of staff PPE, and extensive cleaning of exam room while observing appropriate contact time as indicated for disinfecting solutions.   Orders Placed This Encounter  Procedures   CBC with Differential/Platelet   Comprehensive metabolic panel   Lipid panel   TSH   Testosterone,Free and Total   Hepatitis C  antibody   HIV Antibody (routine testing w rflx)    Meds ordered this encounter  Medications   azithromycin (ZITHROMAX) 250 MG tablet    Sig: Take 2 tabs today, then 1 tab daily for 4 days    Dispense:  1 each    Refill:  0

## 2021-04-05 NOTE — Patient Instructions (Signed)
Please return in 12 months for your annual complete physical; please come fasting.   I will release your lab results to you on your MyChart account with further instructions. Please reply with any questions.    If you have any questions or concerns, please don't hesitate to send me a message via MyChart or call the office at [phone]. Thank you for visiting with us today! It's our pleasure caring for you.   Please do these things to maintain good health!  Exercise at least 30-45 minutes a day,  4-5 days a week.  Eat a low-fat diet with lots of fruits and vegetables, up to 7-9 servings per day. Drink plenty of water daily. Try to drink 8 8oz glasses per day. Seatbelts can save your life. Always wear your seatbelt. Place Smoke Detectors on every level of your home and check batteries every year. Eye Doctor - have an eye exam every 1-2 years Safe sex - use condoms to protect yourself from STDs if you could be exposed to these types of infections. Avoid heavy alcohol use. If you drink, keep it to less than 2 drinks/day and not every day. Health Care Power of Attorney.  Choose someone you trust that could speak for you if you became unable to speak for yourself. Depression is common in our stressful world.If you're feeling down or losing interest in things you normally enjoy, please come in for a visit.  

## 2021-04-06 LAB — HEPATITIS C ANTIBODY
Hepatitis C Ab: NONREACTIVE
SIGNAL TO CUT-OFF: 0.06 (ref <1.00)

## 2021-04-06 LAB — HIV ANTIBODY (ROUTINE TESTING W REFLEX): HIV 1&2 Ab, 4th Generation: NONREACTIVE

## 2021-04-06 NOTE — Addendum Note (Signed)
Addended by: Katharine Look on: 04/06/2021 03:44 PM   Modules accepted: Orders

## 2021-04-08 ENCOUNTER — Telehealth: Payer: Self-pay | Admitting: Family

## 2021-04-08 NOTE — Telephone Encounter (Signed)
I have try to reach him for the pass 3 days but no answer & voice mail. To come back in for labs

## 2021-04-11 ENCOUNTER — Encounter: Payer: Self-pay | Admitting: Family Medicine

## 2021-04-12 ENCOUNTER — Encounter: Payer: Self-pay | Admitting: Family Medicine

## 2021-04-12 ENCOUNTER — Other Ambulatory Visit: Payer: Medicaid Other

## 2021-04-13 ENCOUNTER — Other Ambulatory Visit: Payer: Self-pay

## 2021-04-13 ENCOUNTER — Other Ambulatory Visit: Payer: Medicaid Other

## 2021-04-13 DIAGNOSIS — Z0000 Encounter for general adult medical examination without abnormal findings: Secondary | ICD-10-CM

## 2021-04-13 DIAGNOSIS — N62 Hypertrophy of breast: Secondary | ICD-10-CM

## 2021-04-19 LAB — TESTOSTERONE,FREE AND TOTAL
Testosterone, Free: 4.4 pg/mL — ABNORMAL LOW (ref 8.7–25.1)
Testosterone: 353 ng/dL (ref 264–916)

## 2021-04-19 NOTE — Progress Notes (Signed)
Please call patient: I have reviewed his/her testosteron results. Levels are fairly ok. Not low enough to recommend supplementing at this time.

## 2021-04-20 ENCOUNTER — Encounter: Payer: Self-pay | Admitting: Family Medicine

## 2021-04-25 ENCOUNTER — Other Ambulatory Visit: Payer: Self-pay

## 2021-04-25 DIAGNOSIS — N62 Hypertrophy of breast: Secondary | ICD-10-CM

## 2021-04-25 DIAGNOSIS — Z5181 Encounter for therapeutic drug level monitoring: Secondary | ICD-10-CM

## 2021-06-16 ENCOUNTER — Encounter: Payer: Self-pay | Admitting: Family Medicine

## 2022-02-20 ENCOUNTER — Encounter: Payer: Self-pay | Admitting: *Deleted

## 2022-02-25 IMAGING — MR MR SHOULDER*R* W/O CM
5 series · 40 of 40 positions shown · non-contrast
Comparison: Right shoulder x-rays dated October 12, 2020.

CLINICAL DATA: Chronic right shoulder pain and limited range of
motion for the past year. No injury or prior surgery.

EXAM:
MRI OF THE RIGHT SHOULDER WITHOUT CONTRAST
TECHNIQUE: Multiplanar, multisequence MR imaging of the shoulder was performed.
No intravenous contrast was administered.

[Series 5: T2 fat-sat · axial · 4.0mm · 0.59mm/px · z∈[-100,-6]mm · 8 of 23 slices shown (1 of 3)]
[im 1/23]
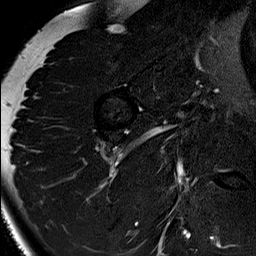
[im 4/23]
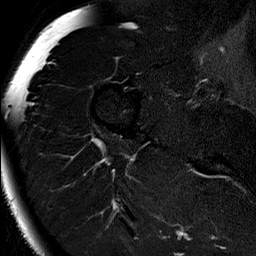
[im 7/23]
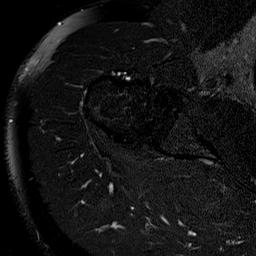
[im 10/23]
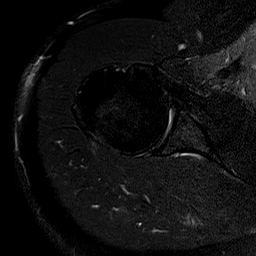
[im 13/23]
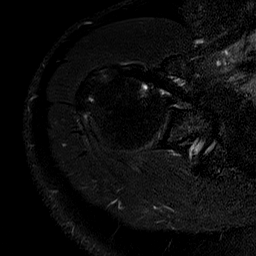
[im 16/23]
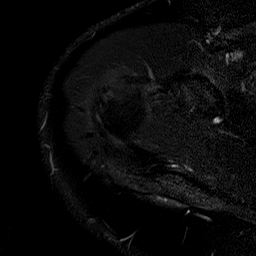
[im 19/23]
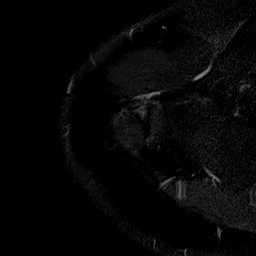
[im 23/23]
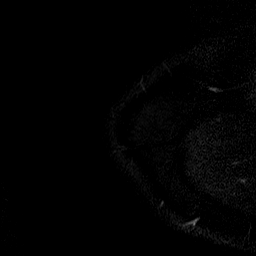

[Series 6: T2 fat-sat · oblique · 4.0mm · 0.59mm/px · 8 of 20 slices shown (2 of 3)]
[im 1/20]
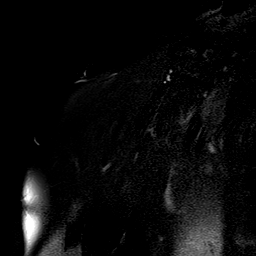
[im 3/20]
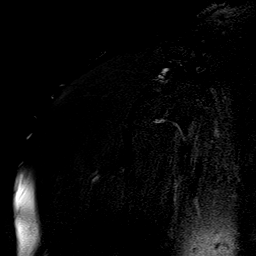
[im 6/20]
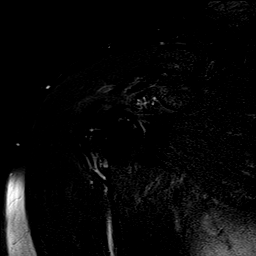
[im 9/20]
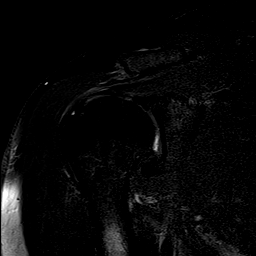
[im 11/20]
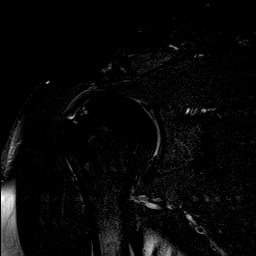
[im 14/20]
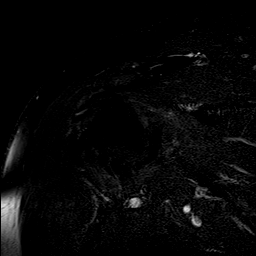
[im 17/20]
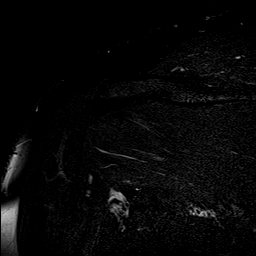
[im 20/20]
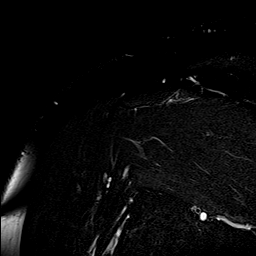

[Series 7: PD · oblique · 4.0mm · 0.59mm/px · 8 of 20 slices shown]
[im 1/20]
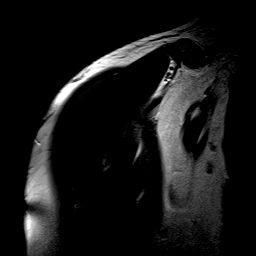
[im 3/20]
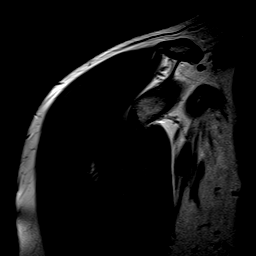
[im 6/20]
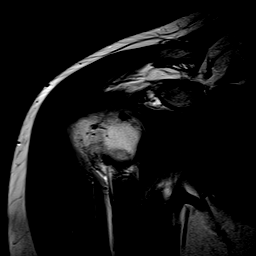
[im 9/20]
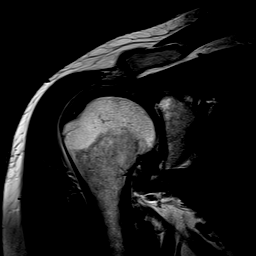
[im 11/20]
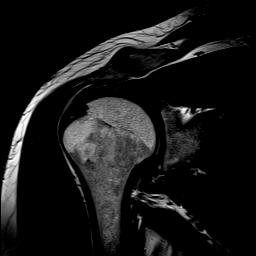
[im 14/20]
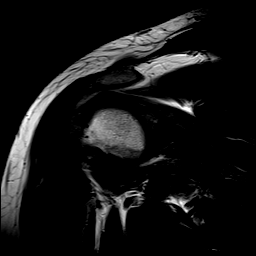
[im 17/20]
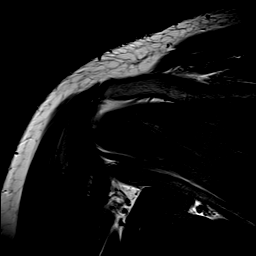
[im 20/20]
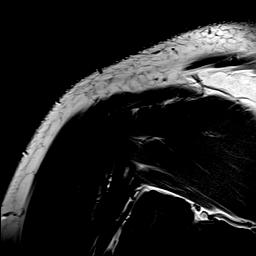

[Series 8: T1 · oblique · 4.0mm · 0.59mm/px · 8 of 20 slices shown]
[im 1/20]
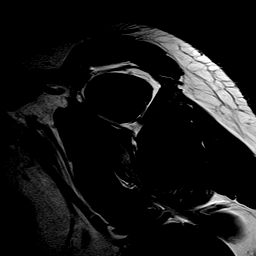
[im 3/20]
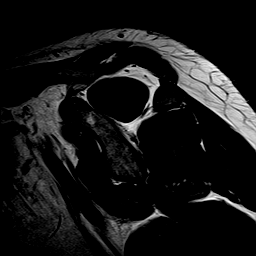
[im 6/20]
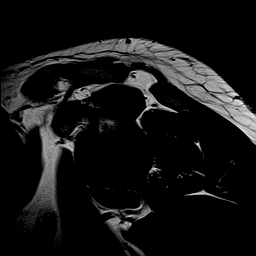
[im 9/20]
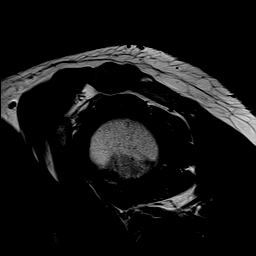
[im 11/20]
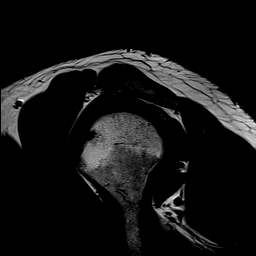
[im 14/20]
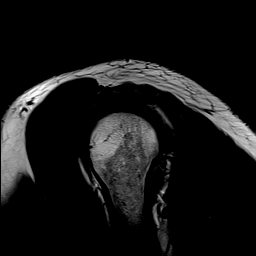
[im 17/20]
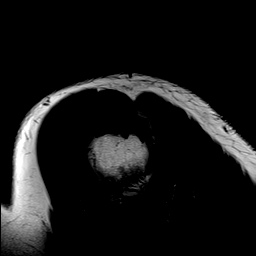
[im 20/20]
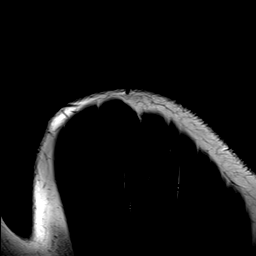

[Series 9: T2 fat-sat · oblique · 4.0mm · 0.59mm/px · 8 of 20 slices shown (3 of 3)]
[im 1/20]
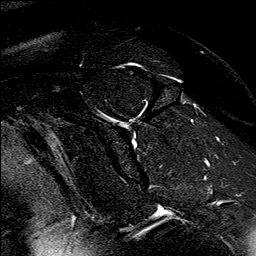
[im 3/20]
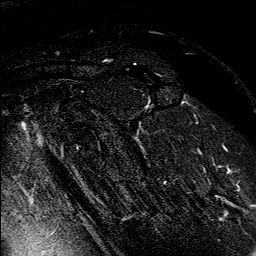
[im 6/20]
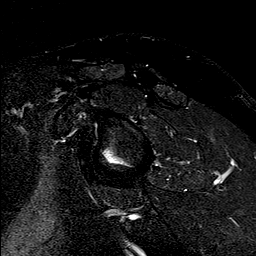
[im 9/20]
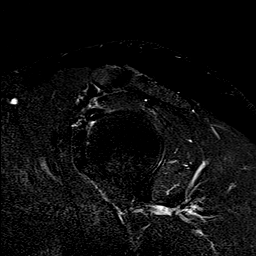
[im 11/20]
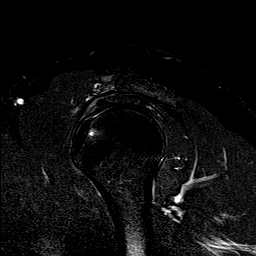
[im 14/20]
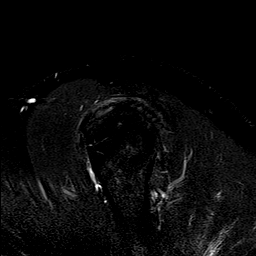
[im 17/20]
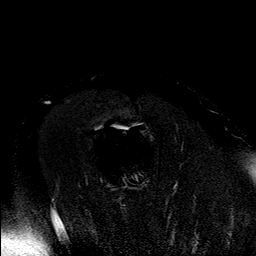
[im 20/20]
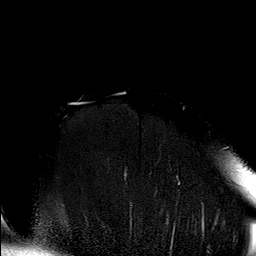

[40 of 40 positions shown; findings below may reference images not displayed]

FINDINGS: Rotator cuff: Mild supraspinatus and infraspinatus tendinosis with
intrasubstance tear involving both insertions, measuring 3.3 cm in
AP dimension. The subscapularis and teres minor tendons are
unremarkable.

Muscles: No atrophy or abnormal signal of the muscles of the rotator
cuff.

Biceps long head:  Intact and normally positioned.

Acromioclavicular Joint: Mild arthropathy of the acromioclavicular
joint. Type II acromion. No subacromial/subdeltoid bursal fluid.

Glenohumeral Joint: No joint effusion. No chondral defect.

Labrum: Grossly intact, but evaluation is limited by lack of
intraarticular fluid.

Bones:  No marrow abnormality, fracture or dislocation.

Other: None.
IMPRESSION: 1. Mild supraspinatus and infraspinatus tendinosis with
intrasubstance tear involving both insertions.
2. Mild acromioclavicular osteoarthritis.

## 2022-04-06 ENCOUNTER — Encounter: Payer: Medicaid Other | Admitting: Family Medicine

## 2022-05-11 ENCOUNTER — Encounter: Payer: Self-pay | Admitting: *Deleted

## 2022-07-07 ENCOUNTER — Encounter: Payer: Medicaid Other | Admitting: Family Medicine

## 2022-10-24 ENCOUNTER — Encounter: Payer: Medicaid Other | Admitting: Family Medicine

## 2023-01-03 ENCOUNTER — Ambulatory Visit: Payer: BC Managed Care – PPO | Admitting: Family Medicine

## 2023-01-03 ENCOUNTER — Encounter: Payer: Self-pay | Admitting: Family Medicine

## 2023-01-03 VITALS — BP 126/90 | HR 91 | Temp 99.0°F | Ht 69.0 in | Wt 244.2 lb

## 2023-01-03 DIAGNOSIS — Z6836 Body mass index (BMI) 36.0-36.9, adult: Secondary | ICD-10-CM | POA: Diagnosis not present

## 2023-01-03 DIAGNOSIS — R030 Elevated blood-pressure reading, without diagnosis of hypertension: Secondary | ICD-10-CM

## 2023-01-03 DIAGNOSIS — E669 Obesity, unspecified: Secondary | ICD-10-CM

## 2023-01-03 DIAGNOSIS — K760 Fatty (change of) liver, not elsewhere classified: Secondary | ICD-10-CM

## 2023-01-03 DIAGNOSIS — Z23 Encounter for immunization: Secondary | ICD-10-CM | POA: Diagnosis not present

## 2023-01-03 DIAGNOSIS — Z0000 Encounter for general adult medical examination without abnormal findings: Secondary | ICD-10-CM | POA: Diagnosis not present

## 2023-01-03 DIAGNOSIS — Z13220 Encounter for screening for lipoid disorders: Secondary | ICD-10-CM | POA: Diagnosis not present

## 2023-01-03 LAB — CBC WITH DIFFERENTIAL/PLATELET
Basophils Absolute: 0.1 10*3/uL (ref 0.0–0.1)
Basophils Relative: 0.9 % (ref 0.0–3.0)
Eosinophils Absolute: 0.1 10*3/uL (ref 0.0–0.7)
Eosinophils Relative: 0.8 % (ref 0.0–5.0)
HCT: 51.1 % (ref 39.0–52.0)
Hemoglobin: 16.5 g/dL (ref 13.0–17.0)
Lymphocytes Relative: 17 % (ref 12.0–46.0)
Lymphs Abs: 1.1 10*3/uL (ref 0.7–4.0)
MCHC: 32.3 g/dL (ref 30.0–36.0)
MCV: 85.2 fl (ref 78.0–100.0)
Monocytes Absolute: 0.4 10*3/uL (ref 0.1–1.0)
Monocytes Relative: 6.2 % (ref 3.0–12.0)
Neutro Abs: 4.8 10*3/uL (ref 1.4–7.7)
Neutrophils Relative %: 75.1 % (ref 43.0–77.0)
Platelets: 206 10*3/uL (ref 150.0–400.0)
RBC: 6 Mil/uL — ABNORMAL HIGH (ref 4.22–5.81)
RDW: 14.1 % (ref 11.5–15.5)
WBC: 6.4 10*3/uL (ref 4.0–10.5)

## 2023-01-03 LAB — LIPID PANEL
Cholesterol: 201 mg/dL — ABNORMAL HIGH (ref 0–200)
HDL: 55.1 mg/dL (ref >39.00)
LDL Cholesterol: 130 mg/dL — ABNORMAL HIGH (ref 0–99)
NonHDL: 146.14
Total CHOL/HDL Ratio: 4
Triglycerides: 80 mg/dL (ref 0.0–149.0)
VLDL: 16 mg/dL (ref 0.0–40.0)

## 2023-01-03 LAB — TSH: TSH: 1.4 u[IU]/mL (ref 0.35–5.50)

## 2023-01-03 LAB — COMPREHENSIVE METABOLIC PANEL
ALT: 25 U/L (ref 0–53)
AST: 20 U/L (ref 0–37)
Albumin: 4.7 g/dL (ref 3.5–5.2)
Alkaline Phosphatase: 53 U/L (ref 39–117)
BUN: 11 mg/dL (ref 6–23)
CO2: 28 mEq/L (ref 19–32)
Calcium: 9.6 mg/dL (ref 8.4–10.5)
Chloride: 102 mEq/L (ref 96–112)
Creatinine, Ser: 0.99 mg/dL (ref 0.40–1.50)
GFR: 96.93 mL/min (ref >60.00)
Glucose, Bld: 101 mg/dL — ABNORMAL HIGH (ref 70–99)
Potassium: 3.9 mEq/L (ref 3.5–5.1)
Sodium: 138 mEq/L (ref 135–145)
Total Bilirubin: 0.7 mg/dL (ref 0.2–1.2)
Total Protein: 7.7 g/dL (ref 6.0–8.3)

## 2023-01-03 MED ORDER — WEGOVY 0.25 MG/0.5ML ~~LOC~~ SOAJ: 0.2500 mg | SUBCUTANEOUS | 0 refills | Status: DC

## 2023-01-03 NOTE — Progress Notes (Signed)
Subjective  Chief Complaint  Patient presents with   Annual Exam    Pt here for Annual Exam and is currently fasting     HPI: Daniel Ramos is a 37 y.o. male who presents to [facility] today for a Male Wellness Visit. He also has the concerns and/or needs as listed above in the chief complaint. These will be addressed in addition to the Health Maintenance Visit.   Wellness Visit: annual visit with health maintenance review and exam   Health maintenance: Last physical 2022.  Has done well.  Works.  Keeps very busy with 2 young boys in sports.  Exercises 3 times weekly.  Diet is variable due to travel.  Would like to lose weight.  Struggling.  Immunizations current.  Eligible for Tdap booster today.  Body mass index is 36.06 kg/m. Wt Readings from Last 3 Encounters:  01/03/23 244 lb 3.2 oz (110.8 kg)  04/05/21 224 lb 9.6 oz (101.9 kg)  03/31/21 233 lb 9.6 oz (106 kg)     Chronic disease management visit and/or acute problem visit: Prehypertension: Blood pressure elevated in office today but improved upon recheck.  He does not check at home.  No chest pain or shortness of breath.  No significant family history. Hepatic steatosis with history of right upper quadrant pain.  Evaluated back in 2017 without clear etiology.  No gallstones.  Negative HIDA scan.  Chart reviewed.  Since, has done well but does still get periodic right upper quadrant pain.  Labs in 2022 showed mildly elevated LFTs.  Assessment  1. Annual physical exam   2. Need for Tdap vaccination   3. Hepatic steatosis   4. Prehypertension   5. Obesity (BMI 30-39.9)      Plan  Male Wellness Visit: Age appropriate Health Maintenance and Prevention measures were discussed with patient. Included topics are cancer screening recommendations, ways to keep healthy (see AVS) including dietary and exercise recommendations, regular eye and dental care, use of seat belts, and avoidance of moderate alcohol  use and tobacco use.  BMI: discussed patient's BMI and encouraged positive lifestyle modifications to help get to or maintain a target BMI. HM needs and immunizations were addressed and ordered. See below for orders. See HM and immunization section for updates.  Tdap updated Routine labs and screening tests ordered including cmp, cbc and lipids where appropriate. Discussed recommendations regarding Vit D and calcium supplementation (see AVS)  Chronic disease f/u and/or acute problem visit: (deemed necessary to be done in addition to the wellness visit): Obesity: Discussed proper diet and exercise.  He exercises regularly and at times has to eat on the ground, fast foods due to travel sports with sons.  He inquires about GLP-1 agonist.  Has not used other medications, phentermine will be contraindicated given his hypertension.  He has exercised and eaten well in the past with limited success.  Goley ordered. Hepatic steatosis and right upper quadrant pain: Recheck lab work.  If pain persist, recommend referral to GI. Prehypertension: Recommend home blood pressure monitoring.  If diastolic remains above 90s, show he will return.  Recommend low-salt diet and weight loss.  Follow up: 2 months to recheck blood pressure and weight Commons side effects, risks, benefits, and alternatives for medications and treatment plan prescribed today were discussed, and the patient expressed understanding of the given instructions. Patient is instructed to call or message via MyChart if he/she has any questions or concerns regarding our treatment plan. No barriers  to understanding were identified. We discussed Red Flag symptoms and signs in detail. Patient expressed understanding regarding what to do in case of urgent or emergency type symptoms.  Medication list was reconciled, printed and provided to the patient in AVS. Patient instructions and summary information was reviewed with the patient as documented in the  AVS. This note was prepared with assistance of Dragon voice recognition software. Occasional wrong-word or sound-a-like substitutions may have occurred due to the inherent limitations of voice recognition software  Orders Placed This Encounter  Procedures   Tdap vaccine greater than or equal to 7yo IM   Lipid panel   Comprehensive metabolic panel   CBC with Differential/Platelet   Meds ordered this encounter  Medications   Semaglutide-Weight Management (WEGOVY) 0.25 MG/0.5ML SOAJ    Sig: Inject 0.25 mg into the skin once a week.    Dispense:  2 mL    Refill:  0     Patient Active Problem List   Diagnosis Date Noted   Gynecomastia, male 04/05/2021   Prehypertension 04/05/2021   Nontraumatic incomplete tear of right rotator cuff 04/05/2021   Health Maintenance  Topic Date Due   INFLUENZA VACCINE  03/13/2023 (Originally 12/28/2022)   DTaP/Tdap/Td (2 - Td or Tdap) 01/02/2033   Hepatitis C Screening  Completed   HIV Screening  Completed   HPV VACCINES  Aged Out   COVID-19 Vaccine  Discontinued   Immunization History  Administered Date(s) Administered   Influenza,inj,Quad PF,6+ Mos 02/24/2018, 03/14/2019   Influenza,inj,quad, With Preservative 03/29/2015   Influenza-Unspecified 03/01/2017   PPD Test 10/01/2014, 12/19/2017, 03/14/2019   Tdap 01/03/2023   We updated and reviewed the patient's past history in detail and it is documented below. Allergies: Patient is allergic to amoxicillin. Past Medical History  has a past medical history of History of chicken pox. Past Surgical History Patient  has a past surgical history that includes No past surgeries. Social History Patient  reports that he has never smoked. His smokeless tobacco use includes chew. He reports that he does not currently use alcohol. He reports that he does not use drugs. Family History family history includes Alcohol abuse in his father and paternal grandfather; Asthma in his son; Brain cancer in his  paternal grandmother; COPD in his maternal grandmother; Healthy in his sister and son; Miscarriages / [place] in his mother; Pancreatic cancer in his maternal grandfather; Stroke in his paternal grandfather. Review of Systems: Constitutional: negative for fever or malaise Ophthalmic: negative for photophobia, double vision or loss of vision Cardiovascular: negative for chest pain, dyspnea on exertion, or new LE swelling Respiratory: negative for SOB or persistent cough Gastrointestinal: negative for abdominal pain, change in bowel habits or melena Genitourinary: negative for dysuria or gross hematuria Musculoskeletal: negative for new gait disturbance or muscular weakness Integumentary: negative for new or persistent rashes Neurological: negative for TIA or stroke symptoms Psychiatric: negative for SI or delusions Allergic/Immunologic: negative for hives  Patient Care Team    Relationship Specialty Notifications Start End  Willow Ora, MD PCP - General Family Medicine  10/12/20    Objective  Vitals: BP (!) 126/90   Pulse 91   Temp 99 F (37.2 C)   Ht 5\' 9"  (1.753 m)   Wt 244 lb 3.2 oz (110.8 kg)   SpO2 97%   BMI 36.06 kg/m  General:  Well developed, well nourished, no acute distress  Psych:  Alert and orientedx3,normal mood and affect HEENT:  Normocephalic, atraumatic, non-icteric sclera,  oropharynx  is clear without mass or exudate, supple neck without adenopathy, or thyromegaly Cardiovascular:  Normal S1, S2, RRR without gallop, rub or murmur,  Respiratory:  Good breath sounds bilaterally, CTAB with normal respiratory effort Gastrointestinal: normal bowel sounds, soft, non-tender, no noted masses. No HSM MSK: Joints are without erythema or swelling.  Skin:  Warm, no rashes Neurologic:    Mental status is normal.  Gross motor and sensory exams are normal. Stable gait. No tremor

## 2023-01-08 ENCOUNTER — Encounter: Payer: Self-pay | Admitting: Family Medicine

## 2023-01-10 ENCOUNTER — Encounter: Payer: Self-pay | Admitting: Family Medicine

## 2023-01-10 NOTE — Progress Notes (Signed)
Labs reviewed.

## 2023-01-11 MED ORDER — SEMAGLUTIDE(0.25 OR 0.5MG/DOS) 2 MG/3ML ~~LOC~~ SOPN: PEN_INJECTOR | SUBCUTANEOUS | 0 refills | Status: AC

## 2023-01-23 ENCOUNTER — Other Ambulatory Visit (HOSPITAL_COMMUNITY): Payer: Self-pay

## 2023-08-17 DIAGNOSIS — K429 Umbilical hernia without obstruction or gangrene: Secondary | ICD-10-CM | POA: Diagnosis not present

## 2023-08-17 DIAGNOSIS — R109 Unspecified abdominal pain: Secondary | ICD-10-CM | POA: Diagnosis not present

## 2023-08-17 DIAGNOSIS — R911 Solitary pulmonary nodule: Secondary | ICD-10-CM | POA: Diagnosis not present

## 2023-08-17 DIAGNOSIS — R1084 Generalized abdominal pain: Secondary | ICD-10-CM | POA: Diagnosis not present

## 2023-08-17 DIAGNOSIS — D72829 Elevated white blood cell count, unspecified: Secondary | ICD-10-CM | POA: Diagnosis not present

## 2023-08-17 DIAGNOSIS — K760 Fatty (change of) liver, not elsewhere classified: Secondary | ICD-10-CM | POA: Diagnosis not present

## 2023-08-17 DIAGNOSIS — F17200 Nicotine dependence, unspecified, uncomplicated: Secondary | ICD-10-CM | POA: Diagnosis not present

## 2023-10-16 ENCOUNTER — Encounter (INDEPENDENT_AMBULATORY_CARE_PROVIDER_SITE_OTHER): Payer: Self-pay | Admitting: Family Medicine

## 2023-10-16 DIAGNOSIS — L0100 Impetigo, unspecified: Secondary | ICD-10-CM

## 2023-10-16 MED ORDER — SULFAMETHOXAZOLE-TRIMETHOPRIM 800-160 MG PO TABS: 1.0000 | ORAL_TABLET | Freq: Two times a day (BID) | ORAL | 0 refills | Status: DC

## 2023-10-16 NOTE — Telephone Encounter (Signed)
 A total of 5 minutes were spent by me to personally review the patient-generated inquiry, review patient records and data pertinent to assessment of the patient's problem, develop a management plan including generation of prescriptions and/or orders, and on subsequent communication with the patient through secure the MyChart portal service. There is no separately reported E/M service related to this service in the past 7 days nor does the patient have an upcoming soonest available appointment for this issue. This work was completed in less than 7 days.      The codes to be used for the E/M service are:  [phone] for five-10 minutes of time spent on the inquiry.  19147 for 11-20 minutes.  [id number] for 21 minutes or more.

## 2023-11-06 DIAGNOSIS — K760 Fatty (change of) liver, not elsewhere classified: Secondary | ICD-10-CM | POA: Diagnosis not present

## 2023-11-06 DIAGNOSIS — R1084 Generalized abdominal pain: Secondary | ICD-10-CM | POA: Diagnosis not present

## 2023-11-06 DIAGNOSIS — R112 Nausea with vomiting, unspecified: Secondary | ICD-10-CM | POA: Diagnosis not present

## 2023-11-08 DIAGNOSIS — K648 Other hemorrhoids: Secondary | ICD-10-CM | POA: Diagnosis not present

## 2023-11-08 DIAGNOSIS — K3189 Other diseases of stomach and duodenum: Secondary | ICD-10-CM | POA: Diagnosis not present

## 2023-11-08 DIAGNOSIS — R1084 Generalized abdominal pain: Secondary | ICD-10-CM | POA: Diagnosis not present

## 2023-11-08 DIAGNOSIS — R112 Nausea with vomiting, unspecified: Secondary | ICD-10-CM | POA: Diagnosis not present

## 2023-11-08 DIAGNOSIS — K2950 Unspecified chronic gastritis without bleeding: Secondary | ICD-10-CM | POA: Diagnosis not present

## 2023-11-08 DIAGNOSIS — K208 Other esophagitis without bleeding: Secondary | ICD-10-CM | POA: Diagnosis not present

## 2023-11-08 DIAGNOSIS — K2282 Esophagogastric junction polyp: Secondary | ICD-10-CM | POA: Diagnosis not present

## 2023-11-08 DIAGNOSIS — B9681 Helicobacter pylori [H. pylori] as the cause of diseases classified elsewhere: Secondary | ICD-10-CM | POA: Diagnosis not present

## 2024-02-05 ENCOUNTER — Ambulatory Visit (INDEPENDENT_AMBULATORY_CARE_PROVIDER_SITE_OTHER): Admitting: Family Medicine

## 2024-02-05 ENCOUNTER — Encounter: Payer: Self-pay | Admitting: Family Medicine

## 2024-02-05 VITALS — BP 133/83 | HR 88 | Temp 99.0°F | Ht 69.0 in | Wt 239.6 lb

## 2024-02-05 DIAGNOSIS — Z0001 Encounter for general adult medical examination with abnormal findings: Secondary | ICD-10-CM | POA: Diagnosis not present

## 2024-02-05 DIAGNOSIS — R030 Elevated blood-pressure reading, without diagnosis of hypertension: Secondary | ICD-10-CM

## 2024-02-05 DIAGNOSIS — E669 Obesity, unspecified: Secondary | ICD-10-CM

## 2024-02-05 DIAGNOSIS — A048 Other specified bacterial intestinal infections: Secondary | ICD-10-CM

## 2024-02-05 DIAGNOSIS — Z6835 Body mass index (BMI) 35.0-35.9, adult: Secondary | ICD-10-CM

## 2024-02-05 DIAGNOSIS — K760 Fatty (change of) liver, not elsewhere classified: Secondary | ICD-10-CM

## 2024-02-05 DIAGNOSIS — R1084 Generalized abdominal pain: Secondary | ICD-10-CM

## 2024-02-05 DIAGNOSIS — K219 Gastro-esophageal reflux disease without esophagitis: Secondary | ICD-10-CM | POA: Diagnosis not present

## 2024-02-05 DIAGNOSIS — R109 Unspecified abdominal pain: Secondary | ICD-10-CM | POA: Insufficient documentation

## 2024-02-05 LAB — LIPID PANEL
Cholesterol: 133 mg/dL (ref 0–200)
HDL: 40.4 mg/dL (ref >39.00)
LDL Cholesterol: 73 mg/dL (ref 0–99)
NonHDL: 92.39
Total CHOL/HDL Ratio: 3
Triglycerides: 95 mg/dL (ref 0.0–149.0)
VLDL: 19 mg/dL (ref 0.0–40.0)

## 2024-02-05 LAB — HEMOGLOBIN A1C: Hgb A1c MFr Bld: 5.8 % (ref 4.6–6.5)

## 2024-02-05 LAB — COMPREHENSIVE METABOLIC PANEL WITH GFR
ALT: 23 U/L (ref 0–53)
AST: 17 U/L (ref 0–37)
Albumin: 4.7 g/dL (ref 3.5–5.2)
Alkaline Phosphatase: 38 U/L — ABNORMAL LOW (ref 39–117)
BUN: 12 mg/dL (ref 6–23)
CO2: 30 meq/L (ref 19–32)
Calcium: 9.8 mg/dL (ref 8.4–10.5)
Chloride: 105 meq/L (ref 96–112)
Creatinine, Ser: 1.15 mg/dL (ref 0.40–1.50)
GFR: 80.37 mL/min (ref >60.00)
Glucose, Bld: 96 mg/dL (ref 70–99)
Potassium: 4.3 meq/L (ref 3.5–5.1)
Sodium: 143 meq/L (ref 135–145)
Total Bilirubin: 0.7 mg/dL (ref 0.2–1.2)
Total Protein: 7.5 g/dL (ref 6.0–8.3)

## 2024-02-05 LAB — CBC WITH DIFFERENTIAL/PLATELET
Basophils Absolute: 0.1 K/uL (ref 0.0–0.1)
Basophils Relative: 1.2 % (ref 0.0–3.0)
Eosinophils Absolute: 0.1 K/uL (ref 0.0–0.7)
Eosinophils Relative: 1.6 % (ref 0.0–5.0)
HCT: 50.7 % (ref 39.0–52.0)
Hemoglobin: 16.7 g/dL (ref 13.0–17.0)
Lymphocytes Relative: 20.4 % (ref 12.0–46.0)
Lymphs Abs: 1 K/uL (ref 0.7–4.0)
MCHC: 33 g/dL (ref 30.0–36.0)
MCV: 85.5 fl (ref 78.0–100.0)
Monocytes Absolute: 0.4 K/uL (ref 0.1–1.0)
Monocytes Relative: 8 % (ref 3.0–12.0)
Neutro Abs: 3.2 K/uL (ref 1.4–7.7)
Neutrophils Relative %: 68.8 % (ref 43.0–77.0)
Platelets: 195 K/uL (ref 150.0–400.0)
RBC: 5.93 Mil/uL — ABNORMAL HIGH (ref 4.22–5.81)
RDW: 14 % (ref 11.5–15.5)
WBC: 4.7 K/uL (ref 4.0–10.5)

## 2024-02-05 LAB — TSH: TSH: 1.58 u[IU]/mL (ref 0.35–5.50)

## 2024-02-05 NOTE — Progress Notes (Signed)
 Subjective  Chief Complaint  Patient presents with   Annual Exam    Pt here for Annul Exam and is currentty fasting    HPI: Daniel Ramos is a 38 y.o. male who presents to [facility] [place] today for a Male Wellness Visit. He also has the concerns and/or needs as listed above in the chief complaint. These will be addressed in addition to the Health Maintenance Visit.   Wellness Visit: annual visit with health maintenance review and exam   HM: declines flu vaccine today. Fair diet. Little exercise. Feels well overall.   Body mass index is 35.38 kg/m. Wt Readings from Last 3 Encounters:  02/05/24 239 lb 9.6 oz (108.7 kg)  01/03/23 244 lb 3.2 oz (110.8 kg)  04/05/21 224 lb 9.6 oz (101.9 kg)   Chronic disease management visit and/or acute problem visit: Prehypertension: Diastolics remain in the 80s.  Feels well.  No significant family history of hypertension.  Sodium in diet may be a little bit high.  Overall diet is fairly healthy.  Weight is down a few pounds. GERD with H. pylori infection by EGD diagnosed in June.  I reviewed all GI records.  He was treated appropriately.  Overall doing well however still reports intermittent flares of upper abdominal pain.  No clear pattern.  Negative biliary workup multiple times.  Has follow-up with GI next week for further evaluation.  Of note colonoscopy was normal. History of hepatic steatosis by imaging.  Monitoring LFTs. Obesity: Of note, tried compounded semaglutide  without successful weight loss.  Assessment  1. Encounter for well adult exam with abnormal findings   2. Prehypertension   3. Gastroesophageal reflux disease without esophagitis   4. Hepatic steatosis   5. H. pylori infection   6. Generalized abdominal pain   7. Obesity (BMI 30-39.9)      Plan  Male Wellness Visit: Age appropriate Health Maintenance and Prevention measures were discussed with patient. Included topics are cancer screening  recommendations, ways to keep healthy (see AVS) including dietary and exercise recommendations, regular eye and dental care, use of seat belts, and avoidance of moderate alcohol use and tobacco use.  BMI: discussed patient's BMI and encouraged positive lifestyle modifications to help get to or maintain a target BMI. HM needs and immunizations were addressed and ordered. See below for orders. See HM and immunization section for updates.  Declines flu vaccination Routine labs and screening tests ordered including cmp, cbc and lipids where appropriate. Discussed recommendations regarding Vit D and calcium supplementation (see AVS)  Chronic disease f/u and/or acute problem visit: (deemed necessary to be done in addition to the wellness visit): Prehypertension: Discussed goals of blood pressure management.  Recommend low-sodium diet, 5 to 10 pound weight loss, increase aerobic exercise and will monitor.  To start blood pressure medications if blood pressure elevates.  Patient understands and agrees.  Monitor renal function and electrolytes.  Screening lipids.  Borderline cholesterol levels last year GERD on omeprazole 40 mg daily.  Overall improved however still with abdominal pain of unknown etiology.  Question irritable bowel or other.  Has follow-up with GI.  Was treated for H. pylori infection Monitor LFTs given hepatic steatosis.  Recommend avoiding processed carbohydrates. Obesity: Increase exercise.  Screen for diabetes  Follow up: 1 year for complete physical [facility] side effects, risks, benefits, and alternatives for medications and treatment plan prescribed today were discussed, and the patient expressed understanding of the given instructions. Patient is instructed to call or  message via MyChart if he/she has any questions or concerns regarding our treatment plan. No barriers to understanding were identified. We discussed Red Flag symptoms and signs in detail. Patient expressed understanding  regarding what to do in case of urgent or emergency type symptoms.  Medication list was reconciled, printed and provided to the patient in AVS. Patient instructions and summary information was reviewed with the patient as documented in the AVS. This note was prepared with assistance of Dragon voice recognition software. Occasional wrong-word or sound-a-like substitutions may have occurred due to the inherent limitations of voice recognition software  Orders Placed This Encounter  Procedures   CBC with Differential/Platelet   Comprehensive metabolic panel with GFR   Lipid panel   Hemoglobin A1c   TSH   No orders of the defined types were placed in this encounter.    Patient Active Problem List   Diagnosis Date Noted   Prehypertension 04/05/2021   Gastroesophageal reflux disease without esophagitis 02/05/2024   Hepatic steatosis 02/05/2024   H. pylori infection 02/05/2024   Abdominal pain 02/05/2024   Gynecomastia, male 04/05/2021   Nontraumatic incomplete tear of right rotator cuff 04/05/2021   Obesity (BMI 30-39.9) 02/05/2024   Health Maintenance  Topic Date Due   Hepatitis B Vaccines 19-59 Average Risk (1 of 3 - 19+ 3-dose series) Never done   HPV VACCINES (1 - 3-dose SCDM series) Never done   Influenza Vaccine  12/28/2023   DTaP/Tdap/Td (2 - Td or Tdap) 01/02/2033   Hepatitis C Screening  Completed   HIV Screening  Completed   Pneumococcal Vaccine  Aged Out   Meningococcal B Vaccine  Aged Out   COVID-19 Vaccine  Discontinued   Immunization History  Administered Date(s) Administered   Influenza,inj,Quad PF,6+ Mos 02/24/2018, 03/14/2019   Influenza,inj,quad, With Preservative 03/29/2015   Influenza-Unspecified 03/01/2017   PPD Test 10/01/2014, 12/19/2017, 03/14/2019   Tdap 01/03/2023   We updated and reviewed the patient's past history in detail and it is documented below. Allergies: Patient is allergic to amoxicillin. Past Medical History  has a past medical history  of History of chicken pox. Past Surgical History Patient  has a past surgical history that includes No past surgeries. Social History Patient  reports that he has never smoked. His smokeless tobacco use includes chew. He reports that he does not currently use alcohol. He reports that he does not use drugs. Family History family history includes Alcohol abuse in his father and paternal grandfather; Asthma in his son; Brain cancer in his paternal grandmother; COPD in his maternal grandmother; Healthy in his sister and son; Miscarriages / [place] in his mother; Pancreatic cancer in his maternal grandfather; Stroke in his paternal grandfather. Review of Systems: Constitutional: negative for fever or malaise Ophthalmic: negative for photophobia, double vision or loss of vision Cardiovascular: negative for chest pain, dyspnea on exertion, or new LE swelling Respiratory: negative for SOB or persistent cough Gastrointestinal: negative for abdominal pain, change in bowel habits or melena Genitourinary: negative for dysuria or gross hematuria Musculoskeletal: negative for new gait disturbance or muscular weakness Integumentary: negative for new or persistent rashes Neurological: negative for TIA or stroke symptoms Psychiatric: negative for SI or delusions Allergic/Immunologic: negative for hives  Patient Care Team    Relationship Specialty Notifications Start End  Jodie Lavern CROME, MD PCP - General Family Medicine  10/12/20   Murriel Barb, MD Referring Physician Gastroenterology  02/05/24    Objective  Vitals: BP 133/83   Pulse 88  Temp 99 F (37.2 C)   Ht 5' 9 (1.753 m)   Wt 239 lb 9.6 oz (108.7 kg)   SpO2 96%   BMI 35.38 kg/m  General:  Well developed, well nourished, no acute distress  Psych:  Alert and orientedx3,normal mood and affect HEENT:  Normocephalic, atraumatic, non-icteric sclera,  oropharynx is clear without mass or exudate, supple neck without adenopathy, or  thyromegaly Cardiovascular:  Normal S1, S2, RRR without gallop, rub or murmur,  Respiratory:  Good breath sounds bilaterally, CTAB with normal respiratory effort Gastrointestinal: normal bowel sounds, soft, non-tender, no noted masses. No HSM MSK: Joints are without erythema or swelling.  Skin:  Warm, no rashes Neurologic:    Mental status is normal.  Gross motor and sensory exams are normal. Stable gait. No tremor

## 2024-02-05 NOTE — Patient Instructions (Signed)
 Please return in 12 months for your annual complete physical; please come fasting.   I will release your lab results to you on your MyChart account with further instructions. You may see the results before I do, but when I review them I will send you a message with my report or have my assistant call you if things need to be discussed. Please reply to my message with any questions. Thank you!   If you have any questions or concerns, please don't hesitate to send me a message via MyChart or call the office at [phone]. Thank you for visiting with us  today! It's our pleasure caring for you.

## 2024-02-08 ENCOUNTER — Ambulatory Visit: Payer: Self-pay | Admitting: Family

## 2024-02-08 DIAGNOSIS — R1010 Upper abdominal pain, unspecified: Secondary | ICD-10-CM | POA: Diagnosis not present

## 2024-02-08 DIAGNOSIS — K760 Fatty (change of) liver, not elsewhere classified: Secondary | ICD-10-CM | POA: Diagnosis not present

## 2024-02-08 DIAGNOSIS — A048 Other specified bacterial intestinal infections: Secondary | ICD-10-CM | POA: Diagnosis not present

## 2024-02-26 DIAGNOSIS — A048 Other specified bacterial intestinal infections: Secondary | ICD-10-CM | POA: Diagnosis not present
